# Patient Record
Sex: Female | Born: 1965 | Race: White | Hispanic: No | Marital: Single | Smoking: Current every day smoker
Health system: Southern US, Community
[De-identification: ages and names within clinical notes are randomized; demographics above are authoritative.]

## PROBLEM LIST (undated history)

## (undated) DIAGNOSIS — J449 Chronic obstructive pulmonary disease, unspecified: Secondary | ICD-10-CM

## (undated) DIAGNOSIS — K449 Diaphragmatic hernia without obstruction or gangrene: Secondary | ICD-10-CM

## (undated) DIAGNOSIS — A048 Other specified bacterial intestinal infections: Secondary | ICD-10-CM

## (undated) DIAGNOSIS — F419 Anxiety disorder, unspecified: Secondary | ICD-10-CM

## (undated) DIAGNOSIS — Z6841 Body Mass Index (BMI) 40.0 and over, adult: Secondary | ICD-10-CM

## (undated) HISTORY — PX: TONSILLECTOMY: SUR1361

## (undated) HISTORY — DX: Anxiety disorder, unspecified: F41.9

## (undated) HISTORY — DX: Chronic obstructive pulmonary disease, unspecified: J44.9

## (undated) HISTORY — PX: KNEE SURGERY: SHX244

## (undated) HISTORY — PX: CHOLECYSTECTOMY: SHX55

## (undated) HISTORY — DX: Diaphragmatic hernia without obstruction or gangrene: K44.9

## (undated) HISTORY — DX: Other specified bacterial intestinal infections: A04.8

## (undated) HISTORY — PX: DILATION AND CURETTAGE OF UTERUS: SHX78

## (undated) HISTORY — DX: Body Mass Index (BMI) 40.0 and over, adult: Z684

---

## 1997-04-27 HISTORY — PX: ABDOMINAL HYSTERECTOMY: SHX81

## 1997-04-27 HISTORY — PX: HEMORRHOID SURGERY: SHX153

## 1997-09-03 ENCOUNTER — Inpatient Hospital Stay (HOSPITAL_COMMUNITY): Admission: RE | Admit: 1997-09-03 | Discharge: 1997-09-05 | Payer: Self-pay | Admitting: Family Medicine

## 1997-10-02 ENCOUNTER — Inpatient Hospital Stay (HOSPITAL_COMMUNITY): Admission: RE | Admit: 1997-10-02 | Discharge: 1997-10-04 | Payer: Self-pay | Admitting: Obstetrics and Gynecology

## 1997-10-11 ENCOUNTER — Other Ambulatory Visit: Admission: RE | Admit: 1997-10-11 | Discharge: 1997-10-11 | Payer: Self-pay | Admitting: Obstetrics and Gynecology

## 1998-07-12 ENCOUNTER — Emergency Department (HOSPITAL_COMMUNITY): Admission: EM | Admit: 1998-07-12 | Discharge: 1998-07-12 | Payer: Self-pay | Admitting: Emergency Medicine

## 1998-07-12 ENCOUNTER — Encounter: Payer: Self-pay | Admitting: Emergency Medicine

## 1998-09-30 ENCOUNTER — Encounter: Admission: RE | Admit: 1998-09-30 | Discharge: 1998-11-01 | Payer: Self-pay | Admitting: Orthopedic Surgery

## 1999-05-12 ENCOUNTER — Emergency Department (HOSPITAL_COMMUNITY): Admission: EM | Admit: 1999-05-12 | Discharge: 1999-05-13 | Payer: Self-pay | Admitting: Emergency Medicine

## 1999-07-10 ENCOUNTER — Encounter: Payer: Self-pay | Admitting: Emergency Medicine

## 1999-07-10 ENCOUNTER — Emergency Department (HOSPITAL_COMMUNITY): Admission: EM | Admit: 1999-07-10 | Discharge: 1999-07-10 | Payer: Self-pay | Admitting: Emergency Medicine

## 1999-08-01 ENCOUNTER — Ambulatory Visit (HOSPITAL_COMMUNITY): Admission: RE | Admit: 1999-08-01 | Discharge: 1999-08-01 | Payer: Self-pay | Admitting: Orthopedic Surgery

## 1999-08-01 ENCOUNTER — Encounter: Payer: Self-pay | Admitting: Orthopedic Surgery

## 1999-08-18 ENCOUNTER — Emergency Department (HOSPITAL_COMMUNITY): Admission: EM | Admit: 1999-08-18 | Discharge: 1999-08-18 | Payer: Self-pay | Admitting: Emergency Medicine

## 1999-09-26 ENCOUNTER — Inpatient Hospital Stay (HOSPITAL_COMMUNITY): Admission: EM | Admit: 1999-09-26 | Discharge: 1999-09-29 | Payer: Self-pay

## 1999-09-26 ENCOUNTER — Encounter (HOSPITAL_BASED_OUTPATIENT_CLINIC_OR_DEPARTMENT_OTHER): Payer: Self-pay | Admitting: General Surgery

## 1999-10-27 ENCOUNTER — Ambulatory Visit (HOSPITAL_COMMUNITY): Admission: RE | Admit: 1999-10-27 | Discharge: 1999-10-27 | Payer: Self-pay | Admitting: General Surgery

## 1999-10-27 ENCOUNTER — Encounter (HOSPITAL_BASED_OUTPATIENT_CLINIC_OR_DEPARTMENT_OTHER): Payer: Self-pay | Admitting: General Surgery

## 2000-01-20 ENCOUNTER — Encounter: Payer: Self-pay | Admitting: Emergency Medicine

## 2000-01-20 ENCOUNTER — Emergency Department (HOSPITAL_COMMUNITY): Admission: EM | Admit: 2000-01-20 | Discharge: 2000-01-20 | Payer: Self-pay | Admitting: Emergency Medicine

## 2002-06-07 ENCOUNTER — Emergency Department (HOSPITAL_COMMUNITY): Admission: EM | Admit: 2002-06-07 | Discharge: 2002-06-08 | Payer: Self-pay | Admitting: Emergency Medicine

## 2002-06-07 ENCOUNTER — Encounter: Payer: Self-pay | Admitting: Emergency Medicine

## 2002-08-05 ENCOUNTER — Emergency Department (HOSPITAL_COMMUNITY): Admission: EM | Admit: 2002-08-05 | Discharge: 2002-08-05 | Payer: Self-pay | Admitting: Emergency Medicine

## 2002-08-05 ENCOUNTER — Encounter: Payer: Self-pay | Admitting: Emergency Medicine

## 2002-09-30 ENCOUNTER — Encounter: Payer: Self-pay | Admitting: Emergency Medicine

## 2002-09-30 ENCOUNTER — Emergency Department (HOSPITAL_COMMUNITY): Admission: EM | Admit: 2002-09-30 | Discharge: 2002-09-30 | Payer: Self-pay | Admitting: Emergency Medicine

## 2005-05-23 ENCOUNTER — Emergency Department (HOSPITAL_COMMUNITY): Admission: EM | Admit: 2005-05-23 | Discharge: 2005-05-23 | Payer: Self-pay | Admitting: Emergency Medicine

## 2007-07-07 ENCOUNTER — Emergency Department (HOSPITAL_COMMUNITY): Admission: EM | Admit: 2007-07-07 | Discharge: 2007-07-07 | Payer: Self-pay | Admitting: Emergency Medicine

## 2007-10-02 ENCOUNTER — Emergency Department (HOSPITAL_COMMUNITY): Admission: EM | Admit: 2007-10-02 | Discharge: 2007-10-03 | Payer: Self-pay | Admitting: Emergency Medicine

## 2007-12-30 ENCOUNTER — Emergency Department (HOSPITAL_COMMUNITY): Admission: EM | Admit: 2007-12-30 | Discharge: 2007-12-30 | Payer: Self-pay | Admitting: Emergency Medicine

## 2008-01-07 ENCOUNTER — Emergency Department (HOSPITAL_COMMUNITY): Admission: EM | Admit: 2008-01-07 | Discharge: 2008-01-07 | Payer: Self-pay | Admitting: Emergency Medicine

## 2008-01-28 ENCOUNTER — Emergency Department (HOSPITAL_COMMUNITY): Admission: EM | Admit: 2008-01-28 | Discharge: 2008-01-28 | Payer: Self-pay | Admitting: Emergency Medicine

## 2008-01-30 ENCOUNTER — Inpatient Hospital Stay (HOSPITAL_COMMUNITY): Admission: EM | Admit: 2008-01-30 | Discharge: 2008-02-01 | Payer: Self-pay | Admitting: Emergency Medicine

## 2008-02-21 ENCOUNTER — Ambulatory Visit: Payer: Self-pay | Admitting: Family Medicine

## 2008-03-01 ENCOUNTER — Emergency Department (HOSPITAL_COMMUNITY): Admission: EM | Admit: 2008-03-01 | Discharge: 2008-03-01 | Payer: Self-pay | Admitting: Emergency Medicine

## 2008-03-19 ENCOUNTER — Ambulatory Visit: Payer: Self-pay | Admitting: Internal Medicine

## 2008-03-27 ENCOUNTER — Encounter: Payer: Self-pay | Admitting: Family Medicine

## 2008-03-27 ENCOUNTER — Ambulatory Visit: Payer: Self-pay | Admitting: Family Medicine

## 2008-03-27 LAB — CONVERTED CEMR LAB
Chloride: 104 meq/L (ref 96–112)
Creatinine, Ser: 0.63 mg/dL (ref 0.40–1.20)
Free T4: 0.94 ng/dL (ref 0.89–1.80)
Potassium: 4.4 meq/L (ref 3.5–5.3)
TSH: 1.566 microintl units/mL (ref 0.350–4.50)

## 2008-03-28 ENCOUNTER — Emergency Department (HOSPITAL_COMMUNITY): Admission: EM | Admit: 2008-03-28 | Discharge: 2008-03-28 | Payer: Self-pay | Admitting: Emergency Medicine

## 2008-04-02 ENCOUNTER — Ambulatory Visit: Payer: Self-pay | Admitting: Family Medicine

## 2008-04-04 ENCOUNTER — Ambulatory Visit (HOSPITAL_COMMUNITY): Admission: RE | Admit: 2008-04-04 | Discharge: 2008-04-04 | Payer: Self-pay | Admitting: Family Medicine

## 2008-05-09 ENCOUNTER — Emergency Department (HOSPITAL_COMMUNITY): Admission: EM | Admit: 2008-05-09 | Discharge: 2008-05-09 | Payer: Self-pay | Admitting: Emergency Medicine

## 2008-05-10 ENCOUNTER — Encounter: Payer: Self-pay | Admitting: Family Medicine

## 2008-05-10 ENCOUNTER — Ambulatory Visit: Payer: Self-pay | Admitting: Internal Medicine

## 2008-05-10 LAB — CONVERTED CEMR LAB
Alkaline Phosphatase: 79 units/L (ref 39–117)
BUN: 10 mg/dL (ref 6–23)
Calcium: 9.2 mg/dL (ref 8.4–10.5)
Chloride: 104 meq/L (ref 96–112)
Creatinine, Ser: 0.61 mg/dL (ref 0.40–1.20)
HDL: 46 mg/dL (ref >39)
HIV-2 Ab: NEGATIVE
LDL Cholesterol: 139 mg/dL — ABNORMAL HIGH (ref 0–99)
Potassium: 4.4 meq/L (ref 3.5–5.3)
Total Bilirubin: 0.3 mg/dL (ref 0.3–1.2)
VLDL: 27 mg/dL (ref 0–40)

## 2008-05-21 ENCOUNTER — Ambulatory Visit: Payer: Self-pay | Admitting: Internal Medicine

## 2008-07-18 ENCOUNTER — Emergency Department (HOSPITAL_BASED_OUTPATIENT_CLINIC_OR_DEPARTMENT_OTHER): Admission: EM | Admit: 2008-07-18 | Discharge: 2008-07-18 | Payer: Self-pay | Admitting: Emergency Medicine

## 2008-07-18 ENCOUNTER — Ambulatory Visit: Payer: Self-pay | Admitting: Interventional Radiology

## 2008-08-08 ENCOUNTER — Encounter: Payer: Self-pay | Admitting: Family Medicine

## 2008-08-08 ENCOUNTER — Ambulatory Visit: Payer: Self-pay | Admitting: Family Medicine

## 2008-08-08 LAB — CONVERTED CEMR LAB
Basophils Relative: 0 % (ref 0–1)
Eosinophils Absolute: 0.1 10*3/uL (ref 0.0–0.7)
MCHC: 33.1 g/dL (ref 30.0–36.0)
MCV: 93.3 fL (ref 78.0–100.0)
Monocytes Absolute: 0.8 10*3/uL (ref 0.1–1.0)
Monocytes Relative: 9 % (ref 3–12)
Neutrophils Relative %: 67 % (ref 43–77)
RBC: 4.63 M/uL (ref 3.87–5.11)

## 2008-08-22 ENCOUNTER — Ambulatory Visit (HOSPITAL_COMMUNITY): Admission: RE | Admit: 2008-08-22 | Discharge: 2008-08-22 | Payer: Self-pay | Admitting: Family Medicine

## 2008-09-21 ENCOUNTER — Encounter: Payer: Self-pay | Admitting: Emergency Medicine

## 2008-09-22 ENCOUNTER — Inpatient Hospital Stay (HOSPITAL_COMMUNITY): Admission: EM | Admit: 2008-09-22 | Discharge: 2008-09-24 | Payer: Self-pay | Admitting: Internal Medicine

## 2008-09-23 ENCOUNTER — Ambulatory Visit: Payer: Self-pay | Admitting: Gastroenterology

## 2008-09-24 ENCOUNTER — Encounter: Payer: Self-pay | Admitting: Gastroenterology

## 2008-09-26 ENCOUNTER — Encounter: Payer: Self-pay | Admitting: Gastroenterology

## 2008-09-27 ENCOUNTER — Telehealth: Payer: Self-pay | Admitting: Gastroenterology

## 2008-10-09 ENCOUNTER — Ambulatory Visit: Payer: Self-pay | Admitting: Internal Medicine

## 2008-11-16 ENCOUNTER — Emergency Department (HOSPITAL_BASED_OUTPATIENT_CLINIC_OR_DEPARTMENT_OTHER): Admission: EM | Admit: 2008-11-16 | Discharge: 2008-11-17 | Payer: Self-pay | Admitting: Emergency Medicine

## 2008-11-17 ENCOUNTER — Emergency Department (HOSPITAL_BASED_OUTPATIENT_CLINIC_OR_DEPARTMENT_OTHER): Admission: EM | Admit: 2008-11-17 | Discharge: 2008-11-17 | Payer: Self-pay | Admitting: Emergency Medicine

## 2008-11-17 ENCOUNTER — Ambulatory Visit: Payer: Self-pay | Admitting: Diagnostic Radiology

## 2008-11-19 ENCOUNTER — Ambulatory Visit: Payer: Self-pay | Admitting: Internal Medicine

## 2008-11-20 ENCOUNTER — Inpatient Hospital Stay (HOSPITAL_COMMUNITY): Admission: EM | Admit: 2008-11-20 | Discharge: 2008-11-24 | Payer: Self-pay | Admitting: Emergency Medicine

## 2008-11-22 ENCOUNTER — Encounter: Payer: Self-pay | Admitting: Internal Medicine

## 2008-12-19 ENCOUNTER — Ambulatory Visit: Payer: Self-pay | Admitting: Internal Medicine

## 2009-02-15 ENCOUNTER — Encounter: Payer: Self-pay | Admitting: Family Medicine

## 2009-02-15 ENCOUNTER — Ambulatory Visit: Payer: Self-pay | Admitting: Internal Medicine

## 2009-02-21 ENCOUNTER — Ambulatory Visit: Payer: Self-pay | Admitting: Internal Medicine

## 2009-04-04 ENCOUNTER — Ambulatory Visit: Payer: Self-pay | Admitting: Family Medicine

## 2009-04-05 ENCOUNTER — Ambulatory Visit (HOSPITAL_COMMUNITY): Admission: RE | Admit: 2009-04-05 | Discharge: 2009-04-05 | Payer: Self-pay | Admitting: Internal Medicine

## 2009-04-09 ENCOUNTER — Ambulatory Visit: Payer: Self-pay | Admitting: Internal Medicine

## 2009-05-03 ENCOUNTER — Emergency Department (HOSPITAL_BASED_OUTPATIENT_CLINIC_OR_DEPARTMENT_OTHER): Admission: EM | Admit: 2009-05-03 | Discharge: 2009-05-03 | Payer: Self-pay | Admitting: Emergency Medicine

## 2009-05-10 ENCOUNTER — Emergency Department (HOSPITAL_COMMUNITY): Admission: EM | Admit: 2009-05-10 | Discharge: 2009-05-10 | Payer: Self-pay | Admitting: Emergency Medicine

## 2009-05-16 ENCOUNTER — Emergency Department (HOSPITAL_COMMUNITY): Admission: EM | Admit: 2009-05-16 | Discharge: 2009-05-16 | Payer: Self-pay | Admitting: Emergency Medicine

## 2009-08-18 ENCOUNTER — Emergency Department (HOSPITAL_COMMUNITY): Admission: EM | Admit: 2009-08-18 | Discharge: 2009-08-18 | Payer: Self-pay | Admitting: Emergency Medicine

## 2009-08-24 ENCOUNTER — Emergency Department (HOSPITAL_COMMUNITY): Admission: EM | Admit: 2009-08-24 | Discharge: 2009-08-24 | Payer: Self-pay | Admitting: Emergency Medicine

## 2010-08-01 IMAGING — CT CT ABDOMEN W/ CM
2 of 5 series · 17 of 46 positions shown, 19 images · IV contrast (agent unspecified)
Comparison: CT abdomen and pelvis without contrast same day

CT ABDOMEN

CLINICAL DATA: Right flank pain and right lower quadrant pain,
acute onset

CT ABDOMEN AND PELVIS WITH CONTRAST
TECHNIQUE: Multidetector CT imaging of the abdomen and pelvis was
performed using the standard protocol following bolus
administration of intravenous contrast.
Contrast: 120 ml Omni 300

[Series 2: abd/pelv with 5.0 b31f st · axial · 0.93mm/px · z∈[-507,-62]mm · 14 of 99 slices shown, 16 images]
[im 5/99  soft-tissue]
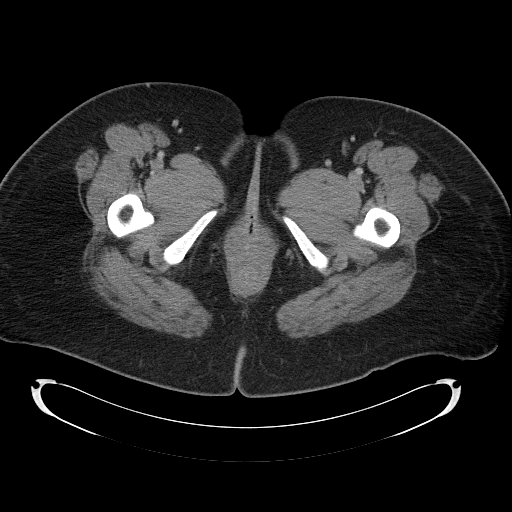
[im 5/99  bone]
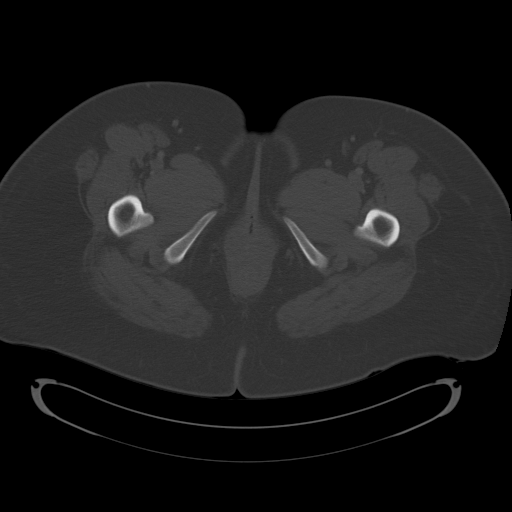
[im 15/99  soft-tissue]
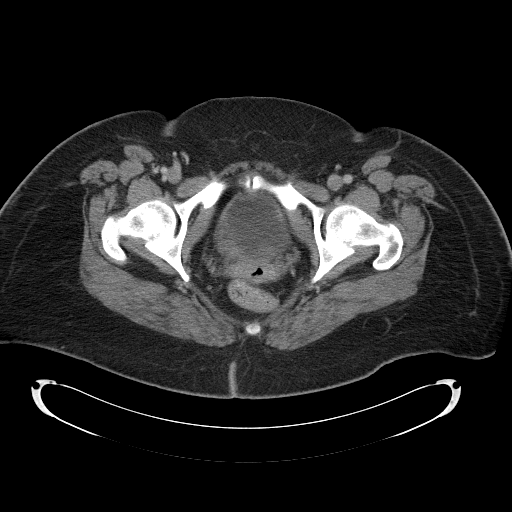
[im 20/99  soft-tissue]
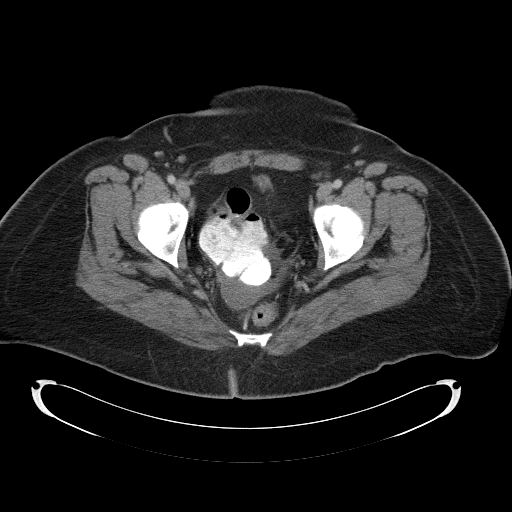
[im 25/99  soft-tissue]
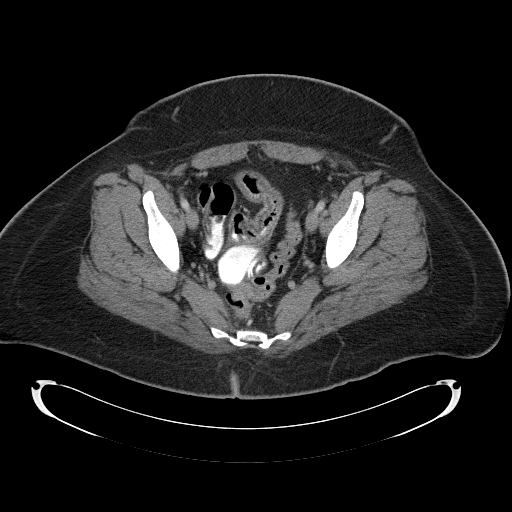
[im 35/99  soft-tissue]
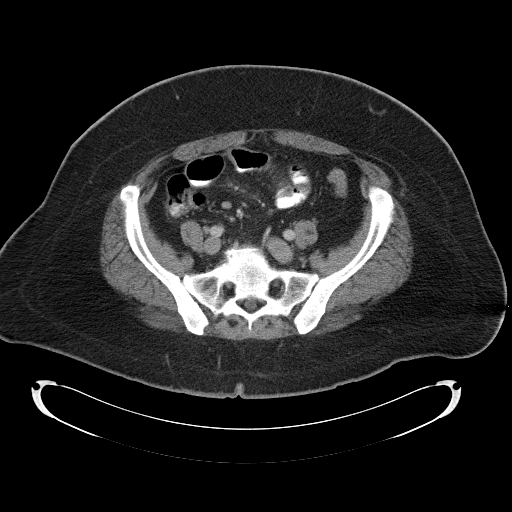
[im 40/99  soft-tissue]
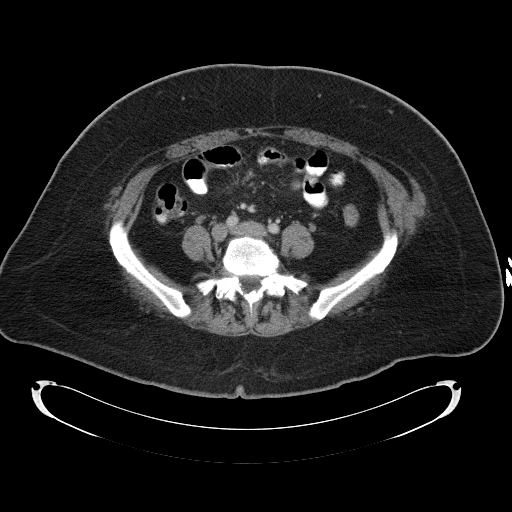
[im 45/99  soft-tissue]
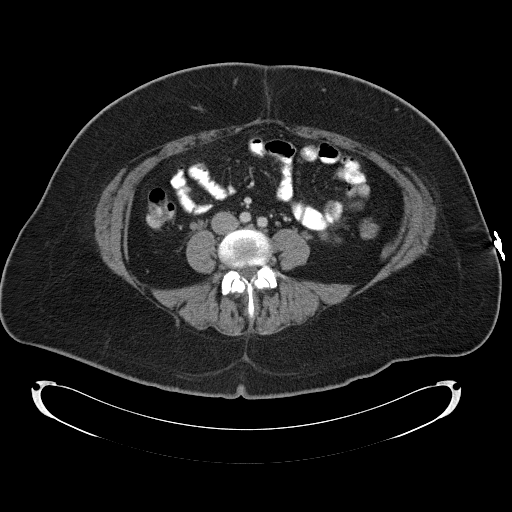
[im 54/99  soft-tissue]
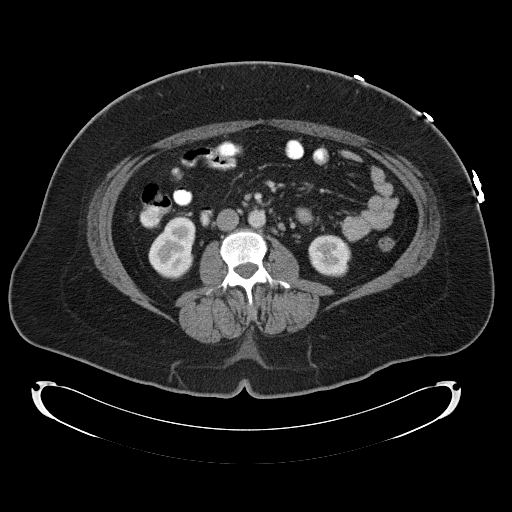
[im 59/99  soft-tissue]
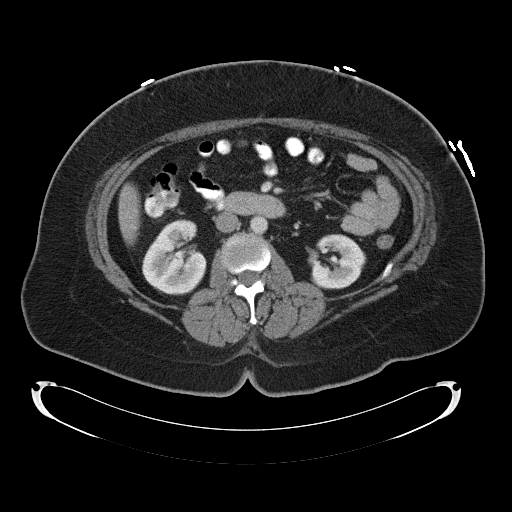
[im 59/99  bone]
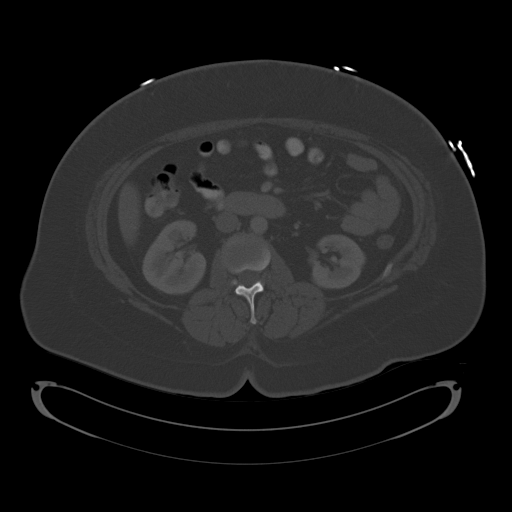
[im 64/99  soft-tissue]
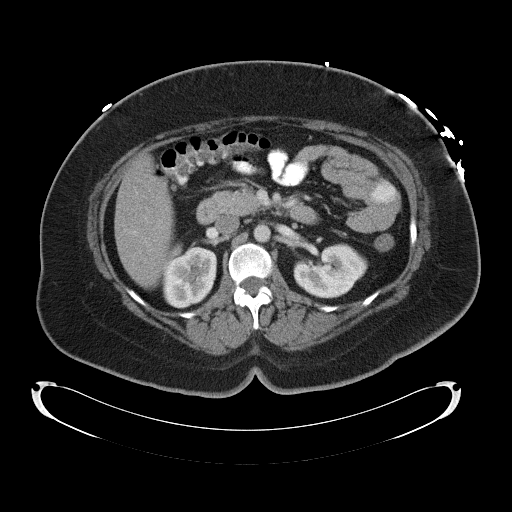
[im 74/99  soft-tissue]
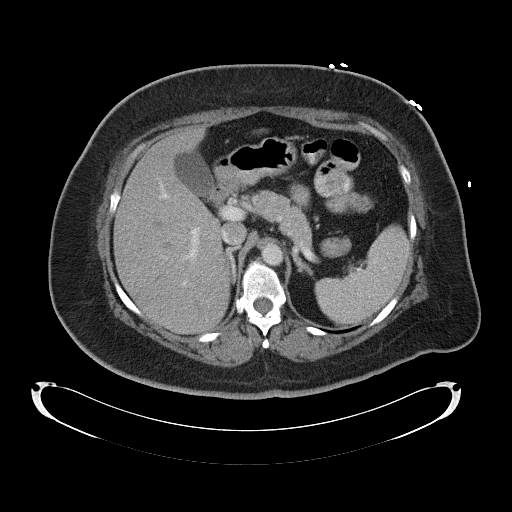
[im 79/99  soft-tissue]
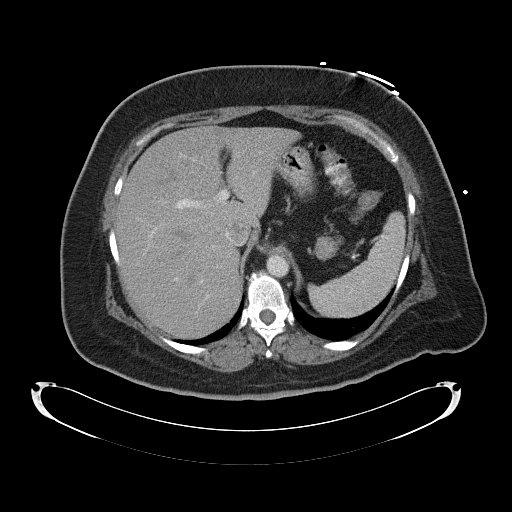
[im 84/99  soft-tissue]
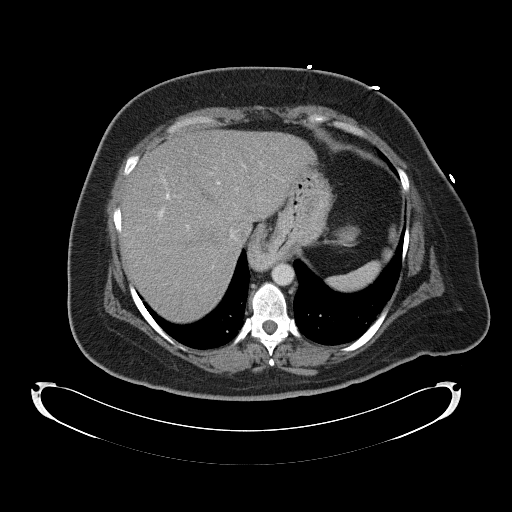
[im 94/99  soft-tissue]
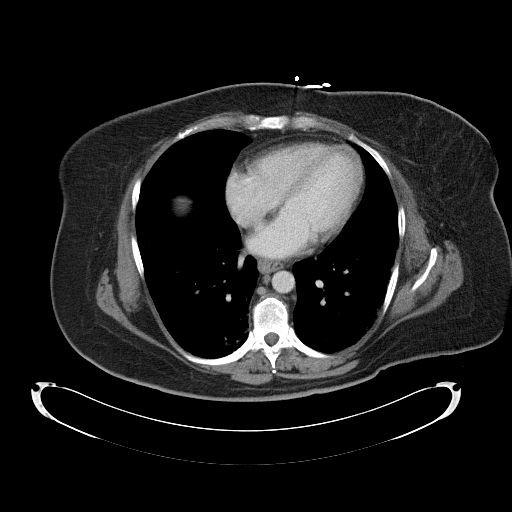

[Series 602: cor a/p · coronal · 0.96mm/px · 3 of 87 slices shown]
[im 29/87  soft-tissue]
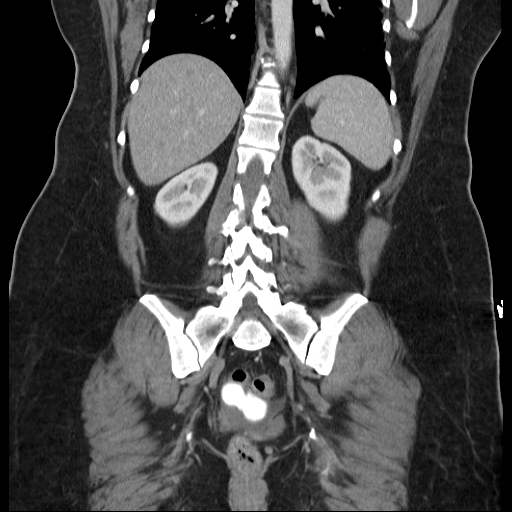
[im 39/87  soft-tissue]
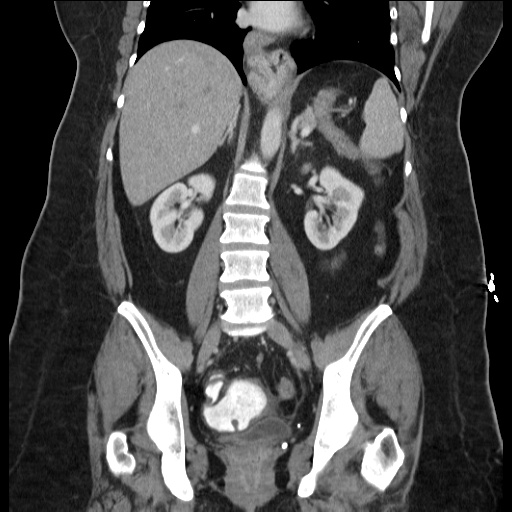
[im 48/87  soft-tissue]
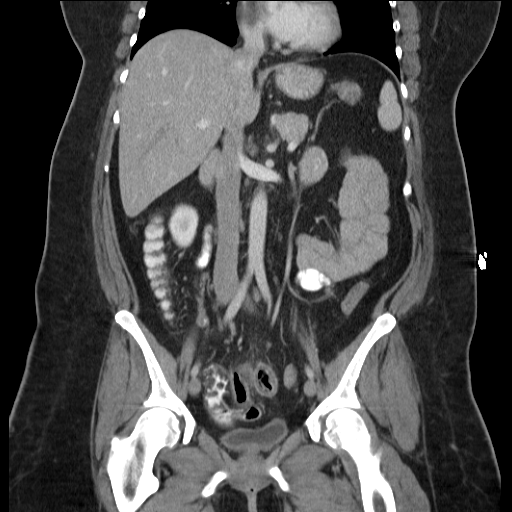

[17 of 46 positions shown; findings below may reference images not displayed]

FINDINGS: Mild bibasilar scarring. Otherwise the lungs appear
clear. Heart appears normal.

No focal hepatic lesion. The gallbladder, pancreas, spleen,
adrenal glands, and kidneys appear normal.

There is a small hiatal hernia. The stomach, duodenum, and
proximal small bowel are normal. There is mucosal thickening of
the distal ileum and terminal ileum (image 74). The the cecum
itself appears normal. The appendix is thin-walled and gas filled
without evidence of inflammation (image 73). The colon appears
normal.

Abdominal aorta is normal caliber. No evidence of retroperitoneal
or periportal lymphadenopathy.
IMPRESSION: 1. Normal appendix.
2. Bowel wall thickening of the distal ileum and terminal ileum.
Differential includes inflammatory bowel disease, terminal ileitis
from infectious etiology would also consider radiation enteritis in
patient with history of ovarian cancer, correlate clinically. Less
likely ischemic colitis as only the small bowel appears involve.

CT PELVIS
FINDINGS: Small amount of free fluid in the pelvis may be related
to the terminal ileitis described above. The bladder appears
normal. Prior hysterectomy. The rectum appears normal. Sigmoid
colon appears normal.

No evidence of pelvic lymphadenopathy.

Review of bone windows demonstrates no aggressive osseous lesions.
IMPRESSION: 1. Small amount of free fluid the pelvis likely related to
ileitis. See above discussion.
2. Hysterectomy and likely oophorectomy.

## 2010-08-03 LAB — URINALYSIS, ROUTINE W REFLEX MICROSCOPIC
Glucose, UA: NEGATIVE mg/dL
Ketones, ur: NEGATIVE mg/dL
Nitrite: NEGATIVE
Protein, ur: NEGATIVE mg/dL
Protein, ur: NEGATIVE mg/dL
Specific Gravity, Urine: 1.027 (ref 1.005–1.030)
Urobilinogen, UA: 1 mg/dL (ref 0.0–1.0)
Urobilinogen, UA: 1 mg/dL (ref 0.0–1.0)
pH: 6.5 (ref 5.0–8.0)

## 2010-08-03 LAB — COMPREHENSIVE METABOLIC PANEL
AST: 19 U/L (ref 0–37)
Albumin: 4.3 g/dL (ref 3.5–5.2)
BUN: 10 mg/dL (ref 6–23)
BUN: 11 mg/dL (ref 6–23)
CO2: 24 mEq/L (ref 19–32)
CO2: 28 mEq/L (ref 19–32)
Chloride: 105 mEq/L (ref 96–112)
Chloride: 104 mEq/L (ref 96–112)
Creatinine, Ser: 0.64 mg/dL (ref 0.4–1.2)
Creatinine, Ser: 0.7 mg/dL (ref 0.4–1.2)
GFR calc non Af Amer: >60 mL/min (ref >60)
GFR calc non Af Amer: >60 mL/min (ref >60)
Glucose, Bld: 86 mg/dL (ref 70–99)
Glucose, Bld: 106 mg/dL — ABNORMAL HIGH (ref 70–99)
Total Bilirubin: 0.4 mg/dL (ref 0.3–1.2)
Total Bilirubin: 0.3 mg/dL (ref 0.3–1.2)

## 2010-08-03 LAB — LIPASE, BLOOD
Lipase: 18 U/L (ref 11–59)
Lipase: 98 U/L (ref 23–300)

## 2010-08-03 LAB — CBC
HCT: 34.4 % — ABNORMAL LOW (ref 36.0–46.0)
HCT: 38.3 % (ref 36.0–46.0)
HCT: 38.6 % (ref 36.0–46.0)
HCT: 44.3 % (ref 36.0–46.0)
Hemoglobin: 13.2 g/dL (ref 12.0–15.0)
MCHC: 33.8 g/dL (ref 30.0–36.0)
MCV: 91.9 fL (ref 78.0–100.0)
MCV: 92.9 fL (ref 78.0–100.0)
MCV: 93.5 fL (ref 78.0–100.0)
MCV: 92.7 fL (ref 78.0–100.0)
Platelets: 172 10*3/uL (ref 150–400)
Platelets: 173 10*3/uL (ref 150–400)
Platelets: 175 10*3/uL (ref 150–400)
Platelets: 255 10*3/uL (ref 150–400)
RBC: 4.17 MIL/uL (ref 3.87–5.11)
RDW: 13.5 % (ref 11.5–15.5)
RDW: 13.7 % (ref 11.5–15.5)
RDW: 13.9 % (ref 11.5–15.5)
WBC: 5.9 10*3/uL (ref 4.0–10.5)
WBC: 6.6 10*3/uL (ref 4.0–10.5)
WBC: 9.3 10*3/uL (ref 4.0–10.5)
WBC: 10.2 10*3/uL (ref 4.0–10.5)

## 2010-08-03 LAB — DIFFERENTIAL
Basophils Absolute: 0.1 10*3/uL (ref 0.0–0.1)
Basophils Absolute: 0.1 10*3/uL (ref 0.0–0.1)
Basophils Absolute: 0.3 10*3/uL — ABNORMAL HIGH (ref 0.0–0.1)
Eosinophils Absolute: 0.1 10*3/uL (ref 0.0–0.7)
Eosinophils Relative: 1 % (ref 0–5)
Eosinophils Relative: 2 % (ref 0–5)
Lymphocytes Relative: 24 % (ref 12–46)
Lymphocytes Relative: 24 % (ref 12–46)
Neutro Abs: 6.1 10*3/uL (ref 1.7–7.7)
Neutro Abs: 6.5 10*3/uL (ref 1.7–7.7)
Neutrophils Relative %: 64 % (ref 43–77)

## 2010-08-03 LAB — LIPID PANEL
Cholesterol: 173 mg/dL (ref 0–200)
HDL: 38 mg/dL — ABNORMAL LOW (ref >39)
LDL Cholesterol: 118 mg/dL — ABNORMAL HIGH (ref 0–99)
Total CHOL/HDL Ratio: 4.6 RATIO

## 2010-08-03 LAB — BASIC METABOLIC PANEL
BUN: 4 mg/dL — ABNORMAL LOW (ref 6–23)
BUN: 8 mg/dL (ref 6–23)
Calcium: 8.6 mg/dL (ref 8.4–10.5)
Chloride: 109 mEq/L (ref 96–112)
Creatinine, Ser: 0.68 mg/dL (ref 0.4–1.2)
Creatinine, Ser: 0.7 mg/dL (ref 0.4–1.2)
GFR calc non Af Amer: >60 mL/min (ref >60)
Glucose, Bld: 90 mg/dL (ref 70–99)
Glucose, Bld: 91 mg/dL (ref 70–99)
Sodium: 139 mEq/L (ref 135–145)

## 2010-08-03 LAB — RAPID URINE DRUG SCREEN, HOSP PERFORMED
Amphetamines: NOT DETECTED
Barbiturates: NOT DETECTED
Cocaine: NOT DETECTED
Opiates: POSITIVE — AB
Tetrahydrocannabinol: NOT DETECTED

## 2010-08-03 LAB — CK TOTAL AND CKMB (NOT AT ARMC)
CK, MB: 0.9 ng/mL (ref 0.3–4.0)
Relative Index: INVALID (ref 0.0–2.5)
Total CK: 58 U/L (ref 7–177)

## 2010-08-03 LAB — URINE MICROSCOPIC-ADD ON

## 2010-08-03 LAB — CLOSTRIDIUM DIFFICILE EIA: C difficile Toxins A+B, EIA: NEGATIVE

## 2010-08-03 LAB — FECAL LACTOFERRIN, QUANT: Fecal Lactoferrin: POSITIVE

## 2010-08-03 LAB — URINE CULTURE

## 2010-08-03 LAB — MAGNESIUM: Magnesium: 1.9 mg/dL (ref 1.5–2.5)

## 2010-08-03 LAB — STOOL CULTURE

## 2010-08-03 LAB — TSH: TSH: 6.129 u[IU]/mL — ABNORMAL HIGH (ref 0.350–4.500)

## 2010-08-03 LAB — LACTIC ACID, PLASMA: Lactic Acid, Venous: 1.3 mmol/L (ref 0.5–2.2)

## 2010-08-03 LAB — OVA AND PARASITE EXAMINATION

## 2010-08-03 LAB — T3, FREE: T3, Free: 2.8 pg/mL (ref 2.3–4.2)

## 2010-08-05 LAB — DIFFERENTIAL
Basophils Absolute: 0 10*3/uL (ref 0.0–0.1)
Basophils Absolute: 0.1 10*3/uL (ref 0.0–0.1)
Basophils Relative: 0 % (ref 0–1)
Basophils Relative: 1 % (ref 0–1)
Eosinophils Absolute: 0.1 10*3/uL (ref 0.0–0.7)
Lymphocytes Relative: 23 % (ref 12–46)
Monocytes Absolute: 0.8 10*3/uL (ref 0.1–1.0)
Monocytes Relative: 11 % (ref 3–12)
Neutro Abs: 5.6 10*3/uL (ref 1.7–7.7)
Neutrophils Relative %: 66 % (ref 43–77)

## 2010-08-05 LAB — CBC
HCT: 43.9 % (ref 36.0–46.0)
Hemoglobin: 13.6 g/dL (ref 12.0–15.0)
Hemoglobin: 15.1 g/dL — ABNORMAL HIGH (ref 12.0–15.0)
MCHC: 33.7 g/dL (ref 30.0–36.0)
MCV: 92.2 fL (ref 78.0–100.0)
MCV: 93.3 fL (ref 78.0–100.0)
Platelets: 223 10*3/uL (ref 150–400)
RBC: 4.34 MIL/uL (ref 3.87–5.11)
RDW: 14 % (ref 11.5–15.5)
WBC: 8.5 10*3/uL (ref 4.0–10.5)

## 2010-08-05 LAB — BASIC METABOLIC PANEL
CO2: 28 mEq/L (ref 19–32)
Calcium: 8.9 mg/dL (ref 8.4–10.5)
Chloride: 104 mEq/L (ref 96–112)
Creatinine, Ser: 0.64 mg/dL (ref 0.4–1.2)
GFR calc Af Amer: >60 mL/min (ref >60)
Glucose, Bld: 94 mg/dL (ref 70–99)

## 2010-08-05 LAB — COMPREHENSIVE METABOLIC PANEL
Albumin: 4 g/dL (ref 3.5–5.2)
Alkaline Phosphatase: 78 U/L (ref 39–117)
BUN: 9 mg/dL (ref 6–23)
CO2: 27 mEq/L (ref 19–32)
Chloride: 105 mEq/L (ref 96–112)
Creatinine, Ser: 0.74 mg/dL (ref 0.4–1.2)
GFR calc non Af Amer: >60 mL/min (ref >60)
Glucose, Bld: 113 mg/dL — ABNORMAL HIGH (ref 70–99)
Potassium: 3.6 mEq/L (ref 3.5–5.1)
Total Bilirubin: 0.3 mg/dL (ref 0.3–1.2)

## 2010-08-05 LAB — CARDIAC PANEL(CRET KIN+CKTOT+MB+TROPI)
CK, MB: 0.8 ng/mL (ref 0.3–4.0)
Relative Index: INVALID (ref 0.0–2.5)
Total CK: 47 U/L (ref 7–177)

## 2010-08-05 LAB — CK TOTAL AND CKMB (NOT AT ARMC)
Relative Index: INVALID (ref 0.0–2.5)
Total CK: 62 U/L (ref 7–177)

## 2010-08-05 LAB — PROTIME-INR
INR: 1 (ref 0.00–1.49)
Prothrombin Time: 13.2 seconds (ref 11.6–15.2)

## 2010-08-05 LAB — D-DIMER, QUANTITATIVE: D-Dimer, Quant: 0.47 ug/mL-FEU (ref 0.00–0.48)

## 2010-08-05 LAB — POCT CARDIAC MARKERS
Myoglobin, poc: 34.6 ng/mL (ref 12–200)
Troponin i, poc: <0.05 ng/mL (ref 0.00–0.09)

## 2010-08-05 LAB — LIPASE, BLOOD: Lipase: 20 U/L (ref 11–59)

## 2010-08-05 LAB — CLOTEST (H. PYLORI), BIOPSY: Helicobacter screen: POSITIVE — AB

## 2010-08-11 LAB — POCT I-STAT, CHEM 8
HCT: 44 % (ref 36.0–46.0)
Hemoglobin: 15 g/dL (ref 12.0–15.0)
Potassium: 4.1 mEq/L (ref 3.5–5.1)
Sodium: 140 mEq/L (ref 135–145)
TCO2: 26 mmol/L (ref 0–100)

## 2010-09-08 ENCOUNTER — Emergency Department (HOSPITAL_BASED_OUTPATIENT_CLINIC_OR_DEPARTMENT_OTHER)
Admission: EM | Admit: 2010-09-08 | Discharge: 2010-09-08 | Disposition: A | Discharge status: Home / Self Care | Payer: Managed Care, Other (non HMO) | Attending: Emergency Medicine | Admitting: Emergency Medicine

## 2010-09-08 DIAGNOSIS — A599 Trichomoniasis, unspecified: Secondary | ICD-10-CM | POA: Insufficient documentation

## 2010-09-08 DIAGNOSIS — I10 Essential (primary) hypertension: Secondary | ICD-10-CM | POA: Insufficient documentation

## 2010-09-08 DIAGNOSIS — R197 Diarrhea, unspecified: Secondary | ICD-10-CM | POA: Insufficient documentation

## 2010-09-08 DIAGNOSIS — F411 Generalized anxiety disorder: Secondary | ICD-10-CM | POA: Insufficient documentation

## 2010-09-08 DIAGNOSIS — F17200 Nicotine dependence, unspecified, uncomplicated: Secondary | ICD-10-CM | POA: Insufficient documentation

## 2010-09-08 LAB — CBC
HCT: 42.2 % (ref 36.0–46.0)
MCHC: 33.9 g/dL (ref 30.0–36.0)
Platelets: 188 10*3/uL (ref 150–400)
RDW: 14.1 % (ref 11.5–15.5)
WBC: 9.6 10*3/uL (ref 4.0–10.5)

## 2010-09-08 LAB — DIFFERENTIAL
Basophils Absolute: 0 10*3/uL (ref 0.0–0.1)
Basophils Relative: 0 % (ref 0–1)
Eosinophils Relative: 1 % (ref 0–5)
Lymphocytes Relative: 9 % — ABNORMAL LOW (ref 12–46)

## 2010-09-08 LAB — COMPREHENSIVE METABOLIC PANEL
ALT: 18 U/L (ref 0–35)
AST: 16 U/L (ref 0–37)
Albumin: 4 g/dL (ref 3.5–5.2)
Alkaline Phosphatase: 83 U/L (ref 39–117)
Calcium: 9.5 mg/dL (ref 8.4–10.5)
GFR calc Af Amer: >60 mL/min (ref >60)
Glucose, Bld: 108 mg/dL — ABNORMAL HIGH (ref 70–99)
Potassium: 3.8 mEq/L (ref 3.5–5.1)
Sodium: 137 mEq/L (ref 135–145)
Total Protein: 7 g/dL (ref 6.0–8.3)

## 2010-09-08 LAB — URINALYSIS, ROUTINE W REFLEX MICROSCOPIC
Bilirubin Urine: NEGATIVE
Ketones, ur: NEGATIVE mg/dL

## 2010-09-08 LAB — URINE MICROSCOPIC-ADD ON
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
## 2010-09-09 NOTE — H&P (Signed)
[name], [name]                ACCOUNT NO.:  [id number]   MEDICAL RECORD NO.:  [id number]          PATIENT TYPE:  INP   LOCATION:  1333                         [facility]AC[facility]   PHYSICIAN:  Lucile Crater, MD         DATE OF BIRTH:  Dec 23, 1965   DATE OF ADMISSION:  11/20/2008  DATE OF DISCHARGE:                              HISTORY & PHYSICAL   PRIMARY CARE PHYSICIAN:  [facility]   CHIEF COMPLAINT:  Abdominal pain for the past few days.   HISTORY OF PRESENT ILLNESS:  [name] is a 45 year old female with  history of ovarian carcinoma in the past, status post TAH with BSO.  She  comes to the ER with 3-4 days complaining of abdominal pain.  The pain  was located in the left and right lower quadrants.  It is nonradiating,  10/10 in intensity.  It is sharp in nature.  The onset was sudden, and  the course was persistent.  However, the pain is recurrent, lasts for up  to one hour when she gets it.  It has worsened with meals.  There is no  association with bowel movements.  She also reports to have 2-3 loose  stools per day, but this is not acute.  She denies having any fever or  chills.  There is no nausea or vomiting associated with the abdominal  pain.  She also denies having melena or red blood per rectum.  She had a  CT scan done on July 24 as part of a workup for this abdominal pain that  showed thickening of the ileal wall which was concerning for irritable  bowel disease versus infectious versus radiation enteritis.  The patient  did not have radiation therapy ever in her life.   REVIEW OF SYSTEMS:  Right ear pain.  Other than that, a complete review  of systems was done which included General, Head, Eyes, Ears, Nose,  Throat, Cardiovascular, Respiratory, GI/GU, Endocrine, Musculoskeletal,  Neurological, Psychiatric all within normal limits other than as  mentioned above.   PAST MEDICAL HISTORY:  1. Atypical chest pain.  2. Gastritis.  3. Esophageal dysmotility.  4.  Generalized anxiety disorder.  5. Ovarian cancer status post hysterectomy and bilateral salpingo-      oophorectomy.  6. Hypertension.  7. Gastroesophageal reflux disease.  8. Left knee arthroscopic surgery.  9. Hyperlipidemia.  10.Obesity.  11.Elevated TSH.   ALLERGIES:  ASPIRIN CAUSES ANAPHYLAXIS.   SOCIAL HISTORY:  History of tobacco abuse which is ongoing.  She smokes  one pack of cigarettes per day.  She rarely consumes alcohol.  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   FAMILY HISTORY:  Coronary artery disease in her father.  Her mother  passed away secondary to cancer.   PHYSICAL EXAMINATION:  VITAL SIGNS:  T-max 98.7, blood pressure 102/54,  O2 saturation 99% on room air, pulse rate 79, respirations 18.  GENERAL:  Not in acute distress.  Awake, alert and oriented to time,  place, person and  situation.  Afebrile.  HEENT:  Normocephalic, atraumatic.  Pupils equal and reactive to light  and accommodation.  Extraocular movements intact.  Mucous membranes  moist.  Questionable right tympanic membrane perforation.  NECK:  Supple.  No JVD, lymphadenopathy or carotid bruits.  CV:  Regular rhythm.  Rate is normal.  No murmurs, rubs or gallops.  LUNGS:  Clear to auscultation bilaterally.  ABDOMEN:  Candor all over, most marked in the right and left lower  quadrants.  No costovertebral angle tenderness.  No hepatosplenomegaly.  EXTREMITIES:  No cyanosis, clubbing or edema.  NEUROLOGICAL:  Grossly nonfocal.   LABORATORY DATA:  CBC with differential:  WBC 9300, hemoglobin 13,  hematocrit 38, platelets 98,000, differential within normal limits.  Sodium 137, potassium 4, chloride 105, bicarbonate 24, BUN 10,  creatinine 0.64, blood glucose 86.  Total protein 6.3, albumin 3.8, ALT  17, AST 19, alk-phos 68, total bilirubin 0.4.  Serum lipase 18.  Urinalysis:  Within normal limits.   CT scan of the abdomen dated November 17, 2008:   1. Terminal ileum has a normal appearance, now with subtle stranding      in the adjacent mesentery and involving the pericecal fat.      differential considerations remain irritable bowel disease,      recurrent infection/typhlitis , radiation enteritis.  2. Mild paraesophageal hernia.  3. Trace pelvic free fluid decreased from previous exam.  Otherwise,      no acute findings identified in the pelvis.   ASSESSMENT/PLAN:  1. Abdominal pain.  The patient has been having this pain. She does      look to be in pain.  She has mild diarrhea, but not very      significant.  We will evaluate for any infectious etiology with      stool WBC and stool ova and parasites.  We will also get a stool      fat to evaluate if she is having any liver abnormalities.  The CT      scan was concerning for thickening in the ileum.  Possibilities      include IBD versus typhlitis versus infectious versus radiation      enteritis, but she does not have a history of radiation therapy.      We will consult with GI for a possible endoscopy and biopsy.  Will      make her n.p.o., give her plenty of IV fluids and will deliver with      pain control.  2. Gastroesophageal reflux disease.  Will start her on PPI.  3. Esophageal dysmotility.  The patient is asymptomatic.  4. Hypertension.  Controlled with medications.  5. Anxiety.  Partly stable.  6. Deep vein thrombosis prophylaxis with SCDs.  7. Fluids, Electrolytes and Nutrition.  Will start her on normal      saline.  Will replace electrolytes as needed.  She will be made      n.p.o.      Lucile Crater, MD  Electronically Signed     TA/MEDQ  D:  11/20/2008  T:  11/20/2008  Job:  [id number]

## 2010-09-09 NOTE — Discharge Summary (Signed)
[name], [name]                ACCOUNT NO.:  [id number]   MEDICAL RECORD NO.:  [id number]          PATIENT TYPE:  INP   LOCATION:  1402                         FACILITY:  [facility]   PHYSICIAN:  Eduard Clos, MDDATE OF BIRTH:  [birth year]   DATE OF ADMISSION:  09/22/2008  DATE OF DISCHARGE:                               DISCHARGE SUMMARY   COURSE IN THE HOSPITAL:  A 45 year old female patient who was  complaining of chest pain was admitted to telemetry floor.  Serial  cardiac enzymes were done which were within acceptable limits.  EKG did  not show any acute findings.  I did discuss with Dr. Clydene Pugh who was on  call for Dr. Patty Sermons.  Per the patient did have a cardiac cath in  October 2009 which showed completely normal carotids, and advised no  further workup cardiac-wise.   Since patient had difficulty swallowing and with his chest pain, I  called GI consult with Dr. Russella Dar who did an EGD which showed only  gastritis and  , and he recommended PPI for 40 mg twice daily for 1  month and followed by daily  .  The patient did have barium swallow  which showed some  per Dr. Russella Dar not significant at this time.  At this  time patient will be discharged home.  I have advised that she will need  to follow up with pending biopsies done during EGD with her family care  physician to which she has agreed.   PROCEDURES DONE DURING STAY:  1. EGD on May 31  .  2. Barium swallow showed esophageal dysmotility,  normal tertiary      contractions.  3. Xray left ankle, nothing acute.  4. Chest x-ray on Sep 21, 2008 showed no acute cardiopulmonary      process, central bronchitic coughing similar to prior changes.   PERTINENT LABORATORIES:  Per this patient's cardiac enzymes are  negative.   FINAL DIAGNOSES:  1. Atypical chest pain.  2. Gastritis  .   MEDICATION AT DISCHARGE:  1. Protonix 40 mg p.o. b.i.d. for 1 month.  p.o. daily thereafter.   PLAN:  The patient advised to follow with  her primary care physician and  follow pending biopsy result and EGD.  To be on a regular diet.  The  patient may follow with Dr. Russella Dar as needed for which contact  information has been given.      Eduard Clos, MD  Electronically Signed    ANK/MEDQ  D:  09/24/2008  T:  09/24/2008  Job:  [id number]

## 2010-09-09 NOTE — Discharge Summary (Signed)
NAMEBHAVIKA, SCHNIDER                ACCOUNT NO.:  [id number]   MEDICAL RECORD NO.:  [id number]          PATIENT TYPE:  INP   LOCATION:  1333                         FACILITY:  [facility]   PHYSICIAN:  Marcellus Scott, MD     DATE OF BIRTH:  1966-02-03   DATE OF ADMISSION:  11/19/2008  DATE OF DISCHARGE:  11/23/2008                               DISCHARGE SUMMARY   PRIMARY CARE PHYSICIAN:  Dr. Tressia Danas at [facility].   DISCHARGE DIAGNOSES:  1. Nonspecific or viral gastroenteritis.  Negative colonoscopy.  2. Right otitis externa +/- otitis media.  3. New hypothyroid.  4. Tobacco abuse.  5. Hypertension.  6. History of gastritis and esophageal dysmotility.  7. History of generalized anxiety disorder.  8. History of hypertension, not on medication.  9. Gastroesophageal reflux disease.  10.Hyperlipidemia, on no medication.   DISCHARGE MEDICATIONS:  1. Nicotine 21 mg per 24 hour patch, one patch daily for 5 weeks then      taper.  Two week supply provided.  2. Levothyroxine 12.5 mcg p.o. daily.  3. Ciprodex otic suspension 2 drops in the right ear b.i.d. for 1      week.  4. Tylenol 650 mg p.o. q.4-6 hourly p.r.n. for pain.   PROCEDURES:  1. Colonoscopy by Dr. Marina Goodell on November 22, 2008.  Impression - normal      colon.  Normal terminal ileum/ileum.  No inflammatory bowel disease      or abnormality.  Suspect nonspecific gastroenteritis.  2. X-ray of the abdomen November 19, 2008.  No evidence of bowel      obstruction or intraperitoneal free air.  3. Chest x-ray November 19, 2008.  Impression - no evidence of acute      cardiopulmonary process.  4. CT of the abdomen with contrast July 24.  Impression - terminal      ileum again has an abnormal appearance, now with subtle stranding      in the adjacent mesentery and involving the pericecal fat.  Top      differential considerations according to radiology were      inflammatory bowel disease, recurrent infection or typhlitis and   radiation enteritis.  Mild progression of paraesophageal hernia.  5. CT of the pelvis.  Impression - trace pelvic free fluid, decreased      from previous exam.  Otherwise no acute findings indicated in the      pelvis.  6. X-ray abdomen on July 24.  Impression - nonobstructive bowel gas      pattern.  Scattered air fluid levels within nondistended bowel are      nonspecific and can be seen with enteritis and diarrhea of various      etiologies.  7. Chest x-ray November 17, 2008.  No acute chest findings.   PERTINENT LABS:  Stool for ova and parasites were negative but had  moderate yeast.  CBC - hemoglobin 13.1, hematocrit 39, white blood cell  6.6, platelets 173.  Stool cultures negative to date.  Free T3 2.8, free  T4 0.72, TSH 5.199.  C. difficile toxin negative.  Basic  metabolic panel  unremarkable with BUN 4, creatinine 0.68, fecal lactoferrin positive,  urine drug screen positive for opiates which she probably received in  the hospital.  TSH of 6.129.  Lipid panel with HDL 38, LDL 118,  otherwise unremarkable.  Troponin 0.02, magnesium 1.9.  Other cardiac  enzymes negative.  Lipase 18.  Hepatic panel July 26 unremarkable.  Urinalysis was not suggestive of urinary tract infection.  Urine  pregnancy test negative.   CONSULTATIONS:  Gastroenterology, Dr. Marina Goodell.   HOSPITAL COURSE AND PATIENT DISPOSITION:  1. Ms. Schellhorn is a pleasant 45 year old Caucasian female patient with a      history of ovarian carcinoma status post total abdominal      hysterectomy and bilateral salpingo-oophorectomy, atypical chest      pain, gastritis, esophageal dysmotility, generalized anxiety      disorder, hypertension, gastroesophageal reflux disease, abnormal      thyroid function tests who presented with abdominal pain ongoing      for 3-4 days worsened by meals.  She also had 2-3 loose stools per      day.  There was no nausea or vomiting.  She was not on any home      medications.  She was admitted to  the hospital.  She had come into      the emergency room 2 days prior to this admission and CT had shown      ileal thickening concerning for irritable bowel syndrome versus      infection versus radiation enteritis.  She was now admitted to the      hospital for further evaluation and management.  GI was consulted.      They proceeded to perform colonoscopy yesterday.  Dr. Marina Goodell      recommends diet as tolerated, avoid narcotics and has cleared her      for discharge.  The patient had been on clear liquids until the      bowel prep.  At this time will advance diet and once the patient      tolerates it she will be discharged home.  The patient indicates      she had some cramping this morning but it is better than yesterday.      Apparently her oxygen saturations dropped briefly when she was      asleep probably secondary to narcotic pain medication and sedation.      Today her oxygen saturations are in the 90s.  She denies snoring or      history of sleep apnea.   1. Hypothyroid: The patient's thyroid function tests are suggestive of      hypothyroidism.  Will start levothyroxine supplementation and      recommend repeating thyroid function tests in 4-6 weeks.   1. Right otitis externa plus or minus otitis media.  The patient      developed sudden onset of right-sided earache and difficulty      hearing 3 days prior to admission.  There was no history of trauma      and no ear discharge.  Our attempts to look into the right ear did      not show any external abnormality although her pinna was tender on      movement.  There was some suspicion of a perforated tympanic      membrane at 12 o'clock position.  She was started on Cipro ear      drops and Augmentin.  ENT was consulted.  Dr. Jearld Fenton  indicated that      she had otitis externa and may have otitis media.  He was unable to      see the tympanic membrane.  I spoke with him this morning and given      her history of abdominal pain I  am hesitant to continue oral      antibiotics.  He recommended the patient could be discharged on      Cipro ear drops alone and is to follow up with him early next week.      The patient is to call for such appointment.   The patient has been advised to seek immediate medical attention if  there is any further deterioration of her condition.  She verbalizes  understanding.  She also requests a nicotine patch to quit tobacco.  Cessation counseling has been done.   At this time will advance diet and if the patient tolerates she will be  discharged home.   Time taken in coordinating this discharge is 35 minutes.      Marcellus Scott, MD  Electronically Signed     AH/MEDQ  D:  11/23/2008  T:  11/23/2008  Job:  [id number]   cc:   Sharin Grave, MD   Wilhemina Bonito. Marina Goodell, MD  [address]  [place]  [place] [address]

## 2010-09-09 NOTE — H&P (Signed)
[name], [name]                ACCOUNT NO.:  [id number]   MEDICAL RECORD NO.:  [id number]          PATIENT TYPE:  EMS   LOCATION:  MAJO                         FACILITY:  [facility]   PHYSICIAN:  Della Goo, M.D. DATE OF BIRTH:  Aug 12, 1965   DATE OF ADMISSION:  09/21/2008  DATE OF DISCHARGE:                              HISTORY & PHYSICAL   PRIMARY CARE PHYSICIAN:  [facility]   CHIEF COMPLAINT:  Chest pain.   HISTORY OF PRESENT ILLNESS:  This is a 45 year old female who presents  to the emergency department with complaints of mid chest area chest pain  which she reports having off and on for the past 2 weeks.  She describes  the chest pain as being heaviness in the center of her chest.  She  states the pain at the worst is about a 9/10.  She states that she has  associated symptoms of shortness of breath, nausea and diaphoresis.  Of  note the patient was hospitalized in October of 2009 for atypical chest  pain at that time.  She had a workup and this was found to be negative.  She underwent stress testing.  The cardiologist involved in her care was  Dr. Patty Lawrence.  The patient also reports having a cardiac  catheterization performed with negative results.  The patient on  discharge was told about her results and was told that her discomfort  was possibly GI or related to esophageal spasms.  This is documented in  the discharge summary.   PAST MEDICAL HISTORY:  Significant for atypical chest pain,  gastroesophageal reflux disease, hypertension, anxiety.  History of a  fracture to the left ankle suffered in October of 2008 per patient  report.   PAST SURGICAL HISTORY:  History of ovarian cancer status post  hysterectomy and bilateral salpingo-oophorectomy, history of a left knee  arthroscopic surgery.   MEDICATIONS AT THIS TIME:  None.   ALLERGIES:  To ASPIRIN which cause anaphylaxis.   SOCIAL HISTORY:  The patient is a smoker.  She is she smokes 1-1/2 packs  of  cigarettes daily and has done so for 30 years.  She is a nondrinker.   FAMILY HISTORY:  Is positive for coronary artery disease in her father,  positive hypertension in both parents, positive for diabetes mellitus in  father, positive for cancer in mother who had ovarian cancer.   REVIEW OF SYSTEMS:  Pertinents are mentioned above.  All other organ  systems are negative.   PHYSICAL EXAMINATION:  GENERAL:  This is a 45 year old overweight female  who is currently in no acute distress.  VITAL SIGNS:  Temperature 98.1, blood pressure 114/79, heart rate 61,  respirations 20, O2 saturations 96-100%.  HEENT: Normocephalic, atraumatic.  Pupils equal round reactive to light.  Extraocular movements are intact, funduscopic benign.  There is no  scleral icterus.  Nares are patent bilaterally.  Tympanic membranes  clear bilaterally.  Oropharynx is clear.  NECK:  Supple full range of motion.  No thyromegaly, adenopathy, jugular  venous distention.  CARDIOVASCULAR:  Regular rate and rhythm.  No murmurs, gallops or rubs.  LUNGS:  Clear to auscultation bilaterally.  ABDOMEN:  Positive bowel sounds, soft, nontender, nondistended.  EXTREMITIES:  Without cyanosis, clubbing or edema.  Please note the  patient was wearing a splint on the left lower extremity.  She states  that she has been wearing this for the past 2 years.  She states that  she has been seen by an orthopedic physician initially, now she states  that she is only being seen currently by workers Agricultural engineer.  She states that she has not seen an orthopedic physician since and  continues to wear her splint on her left lower extremity daily.  The  left lower extremity is without cyanosis, clubbing or edema.  Dorsal  pedal pulses are intact and 2+ of both lower extremities.   LABORATORY STUDIES:  White blood cell count 8.5, hemoglobin 15.1,  hematocrit 43.9, MCV 92.2, platelets 223, neutrophils 66%, lymphocytes  23%.  Sodium 141,  potassium 3.6, chloride 105, carbon dioxide 27, BUN 9,  creatinine 0.74 and glucose 113, calcium 9.5, albumin 4.0, lipase 20.  Point care cardiac markers set #1 with a myoglobin of 34.6, CK-MB less  than 1.0, troponin less than 0.05.  Second set with a myoglobin of 36.9,  CK-MB less than 1.0, troponin less than 0.05, D-dimer 0.47.  Chest x-ray  reveals no acute cardiopulmonary process, central bronchitic markings  are seen.  EKG reveals a normal sinus rhythm without acute ST-segment  changes.   ASSESSMENT:  A 45 year old female being admitted for 23-hour observation  secondary to:  1. Chest pain.  2. History of hypertension.  3. History of gastroesophageal reflux disease.  4. Tobacco abuse.   PLAN:  The patient will be placed in the step-down ICU area since she is  still complaining of having chest discomfort.  The patient will be  medicated with pain medication and placed on Nitropaste, oxygen therapy.  Aspirin therapy will not be given secondary to the patient's reported  history of anaphylaxis with aspirin therapy.  The patient will be placed  on a nicotine patch.  Carafate liquid has also been ordered q.6 h and  the patient will be placed on Bentyl therapy for possible esophageal  spasms.  Further workup will ensue pending results of the patient's  studies and her clinical course.  An x-ray of her left ankle has been  ordered for further evaluation.  Further recommendations will be made  regarding patient's left ankle.  Issues will be deferred to outpatient  management unless there is an acute problem that is seen.      Della Goo, M.D.  Electronically Signed     HJ/MEDQ  D:  09/21/2008  T:  09/22/2008  Job:  [id number]

## 2010-09-09 NOTE — Discharge Summary (Signed)
[name], [name]                ACCOUNT NO.:  [id number]   MEDICAL RECORD NO.:  [id number]          PATIENT TYPE:  INP   LOCATION:  4731                         FACILITY:  [facility]   PHYSICIAN:  Marcellus Scott, MD     DATE OF BIRTH:  [birth year]   DATE OF ADMISSION:  01/29/2008  DATE OF DISCHARGE:  02/01/2008                               DISCHARGE SUMMARY   PRIMARY MEDICAL DOCTOR:  Gentry Fitz.   DISCHARGE DIAGNOSES:  1. Atypical chest pain - negative workup, question secondary to      esophageal spasm.  2. Gastroesophageal reflux disease.  3. Hyperlipidemia.  4. Obesity.  5. Tobacco abuse.  6. Elevated TSH.  7. Question anxiety disorder and depression.   DISCHARGE MEDICATIONS:  1. Xanax 0.25 mg p.o. b.i.d. p.r.n.  2. Propranolol 20 mg p.o. daily.  3..  Protonix 40 mg p.o. daily.   PROCEDURES:  1. Acute abdominal series with chest x-ray.  Impression, bronchitic      changes.  No evidence for focal pulmonary abnormality, been      nonobstructive bowel gas pattern.  2. CT of the abdomen with contrast.  Impression, normal appendix,      bowel wall thickening of the distal ileum and terminal ileum.  3. CT of the pelvis.  Impression is small amount of free fluid in the      pelvis likely related to ileitis, hysterectomy and likely      oophorectomy.  4. CT angiogram of the chest.  Impression:  Negative for pulmonary      embolus, no acute cardiopulmonary disease with bilateral dependent      atelectatic changes noted.  5. Chest x-ray.  Impression, no acute cardiopulmonary findings.  There      is on January 29, 2008.  6. CT of the abdomen without contrast on January 28, 2008.  Impression,      no acute abnormality within the upper abdomen.  Specifically, no      renal calculi.  7. CT of pelvis without contrast on January 28, 2008.  Impression,      abnormal appearance of the distal ileum with fluid collection seen      within the right aspect of the pelvis.  As the appendix was  not      well visualized, followup contrast enhanced examination with      attention of these reasons recommended.   PERTINENT LAB RESULTS:  Lipase 14.  Comprehensive metabolic panel  unremarkable with BUN 9, creatinine 0.7.  Urine microscopy with 3-6  white blood cells and few bacteria.  CBCs were within normal limits.  ESR 13.  Lipid panel with HDL 31, LDL 132, rest unremarkable.  TSH was  5.095, D-dimer was positive at 1.04.   CONSULTATIONS:  Cardiology, Dr. Patty Sermons.   HOSPITAL COURSE AND DISPOSITION:  Ms [name] is a 45 year old female with  history of gastroesophageal reflux disease, status post esophageal  banding secondary to severe reflux, history of ovarian cancer status  post total abdominal hysterectomy and bilateral salpingo-oophorectomy  who presented with chest pain and dyspnea which began 9 a.m. on  the day  of admission.  She was then admitted for further evaluation and  management.  She also was in the emergency room a day prior secondary to  vomiting and diarrhea.   1. Atypical chest pain.  The patient was admitted to telemetry and      extensive workup was done and myocardial infarction was ruled out.      Cardiac catheterization was negative.  CT angiogram of chest was      negative for pulmonary embolism or dissection.  Of note, the      nursing staff indicated that the patient did not look distressed      though she complained of 8-9/10 pain and there was some question of      medication-seeking behavior.  It was explained in detail regarding      workup done which was negative for potentially life-threatening      etiologies and indicated that there is no further need for      inpatient workup.  At this point, the patient became angry, she      started using foul language and saying she should be discharged.      However, within a few minutes, she indicated to the nurse she      wanted to see another MD.  Following this, Dr. Janice Norrie, the      [facility] medical  director was invited to talk to the patient.  He      discussed with her at length and to him she confessed that she was      severely depressed and understood the negative test results and was      agreeable for discharge and wished help obtaining followup medical      care.  The patient was then discharged by Dr. Janice Norrie with medicines      as indicated above.  2. Hyperlipidemia.  3. Obesity.  4. Tobacco abuse.  The patient indicates that she has smoked since age      88 years. The chest pain could have been secondary to acute      bronchitis.  The patient refused to quit, indicating that she will      continue with smoking.  5. Elevated TSH.  To be worked up as an outpatient for hypothyroidism.  6. Question of anxiety and depression.  The patient is to follow up      with PCP and with a possibility of her starting SSRIs as an      outpatient.  Appointment was made.  The patient was provided with      referal to go to [facility] on discharge.      Marcellus Scott, MD  Electronically Signed     AH/MEDQ  D:  03/06/2008  T:  03/07/2008  Job:  [phone] [phone]   cc:   Melvern Banker

## 2010-09-09 NOTE — H&P (Signed)
NAMEVIEVA, BRUMMITT                ACCOUNT NO.:  [id number]   MEDICAL RECORD NO.:  [id number]          PATIENT TYPE:  INP   LOCATION:  4731                         FACILITY:  [facility]   PHYSICIAN:  Della Goo, M.D. DATE OF BIRTH:  [birth year]   DATE OF ADMISSION:  01/29/2008  DATE OF DISCHARGE:                              HISTORY & PHYSICAL   NOTE:  The patient has three different medical accounts.   PRIMARY CARE PHYSICIAN:  Unassigned   CHIEF COMPLAINT:  Chest pain.   HISTORY OF PRESENT ILLNESS:  This is a 45 year old female presenting to  the emergency department with complaints of chest pain and shortness of  breath which started at 9 a.m. on the day of her presentation to the ER.  She reports having chest tightness and rated the pain and discomfort as  being a 10 out 10 at the worst.  This pain was not relieved until  administration of sublingual nitroglycerin x1.  The patient was in the  emergency department 1 day ago secondary to vomiting and diarrhea.  The  patient denies having any cough, congestion, myalgias secondary to chest  pain.   PAST MEDICAL HISTORY:  1. Gastroesophageal reflux disease, status post esophageal banding      secondary to severe reflux.  2. The patient also reports having a history of ovarian cancer in 1999      and underwent a total abdominal hysterectomy with bilateral      salpingo-oophorectomy.  3. The patient also has a surgical history of a hemorrhoidectomy.   MEDICATIONS:  None.   ALLERGIES:  ASPIRIN which causes anaphylaxis.   SOCIAL HISTORY:  The patient lives at home with her son.  She is a  smoker.  Smokes one pack per day for 30+ years and she is a nondrinker.   FAMILY HISTORY:  Positive for mother had ovarian cancer.   REVIEW OF SYSTEMS:  Review of systems pertinents are mentioned above.   PHYSICAL EXAMINATION:  GENERAL:  A 45 year old overweight female in no  visible discomfort or acute distress currently.  VITAL SIGNS:   Temperature 97.3, blood pressure 106/59, heart rate 66,  respirations 17.  Her oxygen saturation initially were 90% on 4 liters.  They have now improved to 98%.  HEENT:  Normocephalic, atraumatic.  Pupils are equally round and  reactive to light.  Extraocular movements are intact.  Funduscopic  benign.  Oropharynx is clear.  NECK:  Supple.  Full range of motion.  No thyromegaly, adenopathy or  jugular venous distention.  CARDIOVASCULAR:  Regular rate and rhythm.  No murmurs, gallops or rubs.  LUNGS:  Clear to auscultation bilaterally.  ABDOMEN:  Positive bowel sounds.  Soft, nontender, nondistended.  EXTREMITIES:  Without cyanosis, clubbing or edema.  NEUROLOGIC:  Nonfocal.   LABORATORY STUDIES:  White blood cell count 8.6, hemoglobin 13.4,  hematocrit 39.9, platelets 203,000, neutrophils 67%, lymphocytes 25%.  Sodium 140, potassium 3.8, chloride 105, bicarb 27, BUN 9, creatinine  0.8, and glucose 95.  Urinalysis reveals trace leukocyte esterase.  D-  dimer equal to 1.04.  CT angiogram study was negative  for a pulmonary  embolism.  Arterial blood gas: pH of 7.543, pCO2 31, pO2 83, bicarb 26.8  and oxygen saturation 98% on 4 liters.  Cardiac enzymes by ISTAT showed  myoglobin 37.3, CK-MB less than 1, troponin less than 0.05.  EKG  findings reveal a normal sinus rhythm without acute ST segment changes.  Chest x-ray negative for any acute disease findings.  However, chronic  bronchitic changes were present.   ASSESSMENT:  A 45 year old female being admitted with:  1. Chest pain.  2. Shortness of breath.  3. Tobacco.  4. Elevated D-dimer   PLAN:  The patient will be admitted to telemetry area.  Cardiac enzymes  will be performed.  The patient will be placed on topical nitrates as  her blood pressure tolerates and supplemental oxygen therapy.  The  patient is severely allergic to aspirin.  This will not be given.  The  patient will be placed on DVT and GI prophylaxis.  Further workup  will  ensue pending the patient's clinical course and results of her cardiac  enzymes.      Della Goo, M.D.  Electronically Signed     HJ/MEDQ  D:  01/30/2008  T:  01/30/2008  Job:  [id number]

## 2011-01-17 ENCOUNTER — Emergency Department (HOSPITAL_COMMUNITY)
Admission: EM | Admit: 2011-01-17 | Discharge: 2011-01-17 | Disposition: A | Discharge status: Home / Self Care | Payer: Self-pay | Attending: Emergency Medicine | Admitting: Emergency Medicine

## 2011-01-17 DIAGNOSIS — F411 Generalized anxiety disorder: Secondary | ICD-10-CM | POA: Insufficient documentation

## 2011-01-17 DIAGNOSIS — I10 Essential (primary) hypertension: Secondary | ICD-10-CM | POA: Insufficient documentation

## 2011-01-17 DIAGNOSIS — K6289 Other specified diseases of anus and rectum: Secondary | ICD-10-CM | POA: Insufficient documentation

## 2011-01-17 DIAGNOSIS — K644 Residual hemorrhoidal skin tags: Secondary | ICD-10-CM | POA: Insufficient documentation

## 2011-01-19 ENCOUNTER — Encounter (INDEPENDENT_AMBULATORY_CARE_PROVIDER_SITE_OTHER): Payer: Self-pay | Admitting: General Surgery

## 2011-01-20 ENCOUNTER — Ambulatory Visit (INDEPENDENT_AMBULATORY_CARE_PROVIDER_SITE_OTHER): Payer: Self-pay | Admitting: Surgery

## 2011-01-20 ENCOUNTER — Encounter (INDEPENDENT_AMBULATORY_CARE_PROVIDER_SITE_OTHER): Payer: Self-pay | Admitting: Surgery

## 2011-01-20 ENCOUNTER — Encounter (INDEPENDENT_AMBULATORY_CARE_PROVIDER_SITE_OTHER): Payer: Self-pay | Admitting: General Surgery

## 2011-01-20 VITALS — BP 132/90 | HR 86 | Ht 64.0 in | Wt 241.0 lb

## 2011-01-20 DIAGNOSIS — K645 Perianal venous thrombosis: Secondary | ICD-10-CM | POA: Insufficient documentation

## 2011-01-20 MED ORDER — OXYCODONE-ACETAMINOPHEN 10-325 MG PO TABS: 1.0000 | ORAL_TABLET | Freq: Four times a day (QID) | ORAL | Status: DC | PRN

## 2011-01-20 NOTE — Progress Notes (Signed)
Subjective:     Patient ID: [name], female   DOB: [birth year], 45 y.o.   MRN: [id number]  HPI Pt seen on 01/17/2011 at [facility] ED due to a 3 day history of rectal pain and bleeding.  Pain severe and located in rectum.  Associated with bleeding. No constipation or diarrhea. No fever or chills or drainage. Sent at the request Jolyne Loa PA .   Review of Systems  Constitutional: Negative.   HENT: Negative.   Eyes: Negative.   Respiratory: Negative.   Cardiovascular: Negative.   Gastrointestinal: Positive for blood in stool and rectal pain.  Genitourinary: Negative.   Musculoskeletal: Negative.   Skin: Negative.   Neurological: Negative.   Hematological: Negative.   Psychiatric/Behavioral: Negative.        Objective:   Physical Exam  Constitutional: She appears well-developed and well-nourished.  HENT:  Head: Normocephalic and atraumatic.  Nose: Nose normal.  Eyes: Conjunctivae and EOM are normal. Pupils are equal, round, and reactive to light.  Neck: Normal range of motion.  Abdominal: Soft. Bowel sounds are normal.  Genitourinary:       THROMBOSED EXTERNAL HEMORRHOID LEFT LATERAL POSITION LARGE TENDER. Small skin tags noted.  Skin: Skin is warm and dry.       Assessment:     Thrombosed external hemorrhoid    Plan:     Incision and drainage in office.  Discussed with the patient treatment options of surgery vs observation or medical.  Risks of bleeding,  Infection  And repeat surgery.  She agreed to incision and drainage.  Under sterile conditions the anoderm was prepped.  1% lidocaine with epi was used an 10 cc injected left lateral anal canal.  Scalpel evacuated clot.  Wound left open.  Dry dressing applied.  Recommend warm tub soaks and analgesia script given Out of work until next Monday.  Questions answered.

## 2011-01-20 NOTE — Patient Instructions (Signed)
Return to work on Monday.  Warm tub soaks 3-4 times a day.  Moist wipes.drink plenty of water.

## 2011-01-21 ENCOUNTER — Telehealth (INDEPENDENT_AMBULATORY_CARE_PROVIDER_SITE_OTHER): Payer: Self-pay | Admitting: General Surgery

## 2011-01-21 NOTE — Telephone Encounter (Signed)
DANA FROM NURSING OFFICE CALLED TO SAY MS Repinski CALLED REQ  SOMETHING STRONGER FOR HER HEMORRHOID PAIN. DR. Luisa Hart ORDERED TRAMADOL 50MG  Q 6HRS PRN PAIN WITH PERCOCET #30 AND LIDOCAINE 2% JELLY APPLY TO RECTUM Q 12 HRS. CALLED TO [facility].  [phone]. PT AWARE.

## 2011-01-21 NOTE — Telephone Encounter (Signed)
Pt called stating pain medication prescribed yesterday was not strong enough. Advised pt dr would be advised and she would be contacted. Info given to [facility]. Pt call back # [phone]. Advised to follow dr orders to soak 3 -4 times daily, keep area clean and drink plenty of water. Advised dr stated in record for her to return to work on Monday. No follow up appt needed unless her condition gets worse or does not improve. Pt was under impression she had stitches and thought they fell out. Advised based on notes in record, stitches were not used for procedure.

## 2011-01-22 LAB — COMPREHENSIVE METABOLIC PANEL
Albumin: 3.9
Alkaline Phosphatase: 88
BUN: 12
Creatinine, Ser: 0.58
Glucose, Bld: 88
Potassium: 4
Total Bilirubin: 0.4
Total Protein: 6.7

## 2011-01-22 LAB — CBC
HCT: 40.8
Hemoglobin: 14.3
MCV: 91.2
Platelets: 255
RDW: 14.6

## 2011-01-22 LAB — POCT CARDIAC MARKERS
Myoglobin, poc: 47
Operator id: 277751
Troponin i, poc: <0.05

## 2011-01-22 LAB — DIFFERENTIAL
Basophils Absolute: 0.1
Basophils Relative: 1
Lymphocytes Relative: 26
Monocytes Absolute: 0.8
Monocytes Relative: 7
Neutro Abs: 7.7
Neutrophils Relative %: 65

## 2011-01-22 LAB — LIPASE, BLOOD: Lipase: 22

## 2011-01-26 LAB — URINALYSIS, ROUTINE W REFLEX MICROSCOPIC
Bilirubin Urine: NEGATIVE
Glucose, UA: NEGATIVE
Glucose, UA: NEGATIVE
Hgb urine dipstick: NEGATIVE
Nitrite: NEGATIVE
Protein, ur: NEGATIVE
Specific Gravity, Urine: 1.009
Specific Gravity, Urine: 1.023
pH: 7

## 2011-01-26 LAB — CBC
HCT: 46.5 — ABNORMAL HIGH
Hemoglobin: 15.3 — ABNORMAL HIGH
MCHC: 32.8
MCHC: 33.8
MCV: 92.8
MCV: 93.8
Platelets: 247
RBC: 4.29
RBC: 4.96
RDW: 13.7
RDW: 13.9
WBC: 13.1 — ABNORMAL HIGH
WBC: 8.6

## 2011-01-26 LAB — POCT I-STAT 3, ART BLOOD GAS (G3+)
O2 Saturation: 98
pCO2 arterial: 31 — ABNORMAL LOW
pH, Arterial: 7.543 — ABNORMAL HIGH
pO2, Arterial: 83

## 2011-01-26 LAB — COMPREHENSIVE METABOLIC PANEL
ALT: 15
ALT: 19
AST: 14
AST: 21
Albumin: 4.1
Alkaline Phosphatase: 80
BUN: 8
Calcium: 8.6
Chloride: 107
Creatinine, Ser: 0.63
GFR calc Af Amer: >60
GFR calc Af Amer: >60
GFR calc non Af Amer: >60
Glucose, Bld: 79
Potassium: 4.2
Sodium: 137
Sodium: 139
Total Protein: 5.6 — ABNORMAL LOW
Total Protein: 6.9

## 2011-01-26 LAB — DIFFERENTIAL
Basophils Absolute: 0
Basophils Relative: 0
Eosinophils Absolute: 0.1
Eosinophils Relative: 1
Lymphocytes Relative: 11 — ABNORMAL LOW
Lymphs Abs: 1.4
Lymphs Abs: 2.1
Monocytes Absolute: 0.6
Monocytes Relative: 5
Monocytes Relative: 7
Neutro Abs: 10.9 — ABNORMAL HIGH
Neutro Abs: 5.7
Neutrophils Relative %: 67
Neutrophils Relative %: 83 — ABNORMAL HIGH

## 2011-01-26 LAB — CARDIAC PANEL(CRET KIN+CKTOT+MB+TROPI)
CK, MB: 1.3
Relative Index: INVALID
Relative Index: INVALID
Total CK: 52
Total CK: 68

## 2011-01-26 LAB — TROPONIN I: Troponin I: 0.04

## 2011-01-26 LAB — POCT CARDIAC MARKERS
CKMB, poc: <1 — ABNORMAL LOW
Myoglobin, poc: 42.5
Troponin i, poc: <0.05

## 2011-01-26 LAB — URINE MICROSCOPIC-ADD ON

## 2011-01-26 LAB — POCT I-STAT, CHEM 8
Creatinine, Ser: 0.8
Hemoglobin: 13.6
Potassium: 3.8
Sodium: 140

## 2011-01-26 LAB — COMPREHENSIVE METABOLIC PANEL WITH GFR
CO2: 23
Calcium: 9.4
Creatinine, Ser: 0.65
GFR calc non Af Amer: >60
Glucose, Bld: 96
Total Bilirubin: 0.6

## 2011-01-26 LAB — LIPASE, BLOOD: Lipase: 18

## 2011-01-26 LAB — LIPID PANEL
Cholesterol: 180
HDL: 31 — ABNORMAL LOW
Total CHOL/HDL Ratio: 5.8
Triglycerides: 87

## 2011-01-26 LAB — CK TOTAL AND CKMB (NOT AT ARMC)
CK, MB: 0.9
Relative Index: INVALID
Total CK: 58

## 2011-01-26 LAB — PROTIME-INR: INR: 0.9

## 2011-01-26 LAB — TSH: TSH: 5.095 — ABNORMAL HIGH

## 2011-01-27 LAB — COMPREHENSIVE METABOLIC PANEL
AST: 16
Albumin: 3.7
Alkaline Phosphatase: 75
BUN: 9
Chloride: 100
GFR calc Af Amer: >60
Potassium: 3.6
Total Bilirubin: 0.5
Total Protein: 6.1

## 2011-01-27 LAB — DIFFERENTIAL
Basophils Relative: 0
Eosinophils Relative: 0
Lymphs Abs: 0.8
Monocytes Absolute: 0.6
Monocytes Relative: 6
Neutro Abs: 8.9 — ABNORMAL HIGH

## 2011-01-27 LAB — URINALYSIS, ROUTINE W REFLEX MICROSCOPIC
Bilirubin Urine: NEGATIVE
Glucose, UA: NEGATIVE
Hgb urine dipstick: NEGATIVE
Ketones, ur: NEGATIVE
Nitrite: NEGATIVE
Specific Gravity, Urine: 1.023
pH: 7

## 2011-01-27 LAB — CBC
HCT: 43.1
MCHC: 33.8
MCV: 91.7
RBC: 4.7
WBC: 10.3

## 2011-01-27 LAB — URINE MICROSCOPIC-ADD ON

## 2011-01-28 LAB — CBC
HCT: 44.6
MCHC: 33.8
MCV: 92.5
Platelets: 242
RBC: 4.82
WBC: 10.1

## 2011-01-28 LAB — COMPREHENSIVE METABOLIC PANEL
BUN: 10
CO2: 26
Calcium: 9.6
Chloride: 105
Creatinine, Ser: 0.74
GFR calc non Af Amer: >60
Total Bilirubin: 0.4

## 2011-01-28 LAB — DIFFERENTIAL
Basophils Absolute: 0
Basophils Relative: 0
Eosinophils Relative: 2
Lymphocytes Relative: 22
Neutro Abs: 7

## 2011-01-28 LAB — URINALYSIS, ROUTINE W REFLEX MICROSCOPIC
Bilirubin Urine: NEGATIVE
Nitrite: NEGATIVE
Protein, ur: NEGATIVE
Specific Gravity, Urine: 1.013
Urobilinogen, UA: 0.2

## 2011-01-28 LAB — LIPASE, BLOOD: Lipase: 19

## 2011-01-28 LAB — URINE MICROSCOPIC-ADD ON

## 2011-03-03 ENCOUNTER — Encounter (INDEPENDENT_AMBULATORY_CARE_PROVIDER_SITE_OTHER): Payer: Self-pay | Admitting: General Surgery

## 2011-03-03 ENCOUNTER — Ambulatory Visit (INDEPENDENT_AMBULATORY_CARE_PROVIDER_SITE_OTHER): Payer: Self-pay | Admitting: General Surgery

## 2011-03-03 DIAGNOSIS — K648 Other hemorrhoids: Secondary | ICD-10-CM | POA: Insufficient documentation

## 2011-03-03 DIAGNOSIS — K645 Perianal venous thrombosis: Secondary | ICD-10-CM

## 2011-03-03 MED ORDER — HYDROCORTISONE 2.5 % RE CREA: TOPICAL_CREAM | Freq: Three times a day (TID) | RECTAL | Status: AC

## 2011-03-03 MED ORDER — HYDROCODONE-ACETAMINOPHEN 5-500 MG PO TABS
1.0000 | ORAL_TABLET | ORAL | Status: AC | PRN
Start: 2011-03-03 — End: 2012-03-02

## 2011-03-03 NOTE — Patient Instructions (Signed)
Hemorrhoids Hemorrhoids are enlarged (dilated) veins around the rectum. There are 2 types of hemorrhoids, and the type of hemorrhoid is determined by its location. Internal hemorrhoids occur in the veins just inside the rectum.They are usually not painful, but they may bleed.However, they may poke through to the outside and become irritated and painful. External hemorrhoids involve the veins outside the anus and can be felt as a painful swelling or hard lump near the anus.They are often itchy and may crack and bleed. Sometimes clots will form in the veins. This makes them swollen and painful. These are called thrombosed hemorrhoids. CAUSES Causes of hemorrhoids include:  Pregnancy. This increases the pressure in the hemorrhoidal veins.   Constipation.   Straining to have a bowel movement.   Obesity.   Heavy lifting or other activity that caused you to strain.  TREATMENT  Most of the time hemorrhoids improve in 1 to 2 weeks. However, if symptoms do not seem to be getting better or if you have a lot of rectal bleeding, your caregiver may perform a procedure to help make the hemorrhoids get smaller or remove them completely.  Take warm sitz baths for 20 to 30 minutes, 3 to 4 times per day.   Use Anusol HC three times per day  Take stool softener twice a day.  Add miralax once per day.  Take dulcolax or other over the counter laxative daily as needed for soft BMs.  Increase fiber in your diet. Ask your caregiver about using fiber supplements.   Drink enough water and fluids to keep your urine clear or pale yellow.   Exercise regularly.   Go to the bathroom when you have the urge to have a bowel movement. Do not wait.   Avoid straining to have bowel movements.   Keep the anal area dry and clean.   Only take over-the-counter or prescription medicines for pain, discomfort, or fever as directed by your caregiver.  If the hemorrhoids are very tender and swollen, place ice packs  on the area as tolerated. Using ice packs between sitz baths may be helpful. Fill a plastic bag with ice. Place a towel between the bag of ice and your skin.   Medicated creams and suppositories may be used or applied as directed.   Do not use a donut-shaped pillow or sit on the toilet for long periods. This increases blood pooling and pain.   SEEK MEDICAL CARE IF:   You have increasing pain and swelling that is not controlled with your medicine.   You have uncontrolled bleeding.   You have difficulty or you are unable to have a bowel movement.   You have pain or inflammation outside the area of the hemorrhoids.   You have chills or an oral temperature above 102 F (38.9 C).   MAKE SURE YOU:   Understand these instructions.   Will watch your condition.   Will get help right away if you are not doing well or get worse.

## 2011-03-03 NOTE — Assessment & Plan Note (Addendum)
Resolved

## 2011-03-03 NOTE — Progress Notes (Signed)
Chief Complaint  Patient presents with  . Other    urgent- eval thromb hems    HISTORY: Pt is s/p excision of thrombosed hemorrhoid 6 weeks ago with Dr. Luisa Hart.  She started having more bleeding from the anus over the last week.  She has been taking stool softeners daily, but still sometimes has to strain to have bowel movements and sometimes has to sit on the toilet for a while.  She has noticed swelling and pain, and she has difficulty cleaning.    Past Medical History  Diagnosis Date  . Hiatal hernia   . Anxiety   . Hemorrhoids     Past Surgical History  Procedure Date  . Abdominal hysterectomy 1999  . Hemorrhoid surgery 1999  . Knee surgery 20 yrs ago    right    No current outpatient prescriptions on file.     Allergies  Allergen Reactions  . Aspirin Anaphylaxis     Family History  Problem Relation Age of Onset  . Stomach cancer Mother   . Diabetes Father      History   Social History  . Marital Status: Single    Spouse Name: N/A    Number of Children: N/A  . Years of Education: N/A   Social History Main Topics  . Smoking status: Current Everyday Smoker -- 0.8 packs/day  . Smokeless tobacco: None  . Alcohol Use: No  . Drug Use: None  . Sexually Active: None   Other Topics Concern  . None   Social History Narrative  . None     REVIEW OF SYSTEMS - PERTINENT POSITIVES ONLY: 12 point review of systems negative other than HPI and PMH. EXAM: Filed Vitals:   03/03/11 1630  BP: 138/72  Pulse: 60  Temp: 96.9 F (36.1 C)  Resp: 16    Gen:  No acute distress.  Well nourished and well groomed.   Neurological: Alert and oriented to person, place, and time. Coordination normal.  Head: Normocephalic and atraumatic.  Eyes: Conjunctivae are normal. Pupils are equal, round, and reactive to light. No scleral icterus.  Rectal:  Pt with circumferential external hemorrhoids which are inflamed.  Digital exam reveals no masses.  Anoscopy reveals  circumferential internal hemorrhoids which are inflamed and are injected with sclerosant.   Respiratory: Effort normal.  No respiratory distress.  Musculoskeletal: Normal range of motion. Extremities are nontender.  Skin: Skin is warm and dry. No rash noted. No diaphoresis. No erythema. No pallor. No clubbing, cyanosis, or edema.   Psychiatric: Normal mood and affect. Behavior is normal. Judgment and thought content normal.   ASSESSMENT AND PLAN:   Hemorrhoids thrombosed Resolved.   Hemorrhoids, internal and external, with bleeding Internal hemorrhoids injected circumferentially. Sitz baths BID and prn. Aggressive control of constipation. Anusol HC Avoid prolonged time on the toilet.   Follow up with me or Dr. Luisa Hart in 6 weeks.          Maudry Diego MD Surgical Oncology, General and Endocrine Surgery [facility]      Visit Diagnoses: 1. Hemorrhoids thrombosed   2. Hemorrhoids, internal and external, with bleeding     Primary Care Physician: No primary provider on file.

## 2011-03-03 NOTE — Assessment & Plan Note (Signed)
Internal hemorrhoids injected circumferentially. Sitz baths BID and prn. Aggressive control of constipation. Anusol HC Avoid prolonged time on the toilet.   Follow up with me or Dr. Luisa Hart in 6 weeks.

## 2011-03-04 ENCOUNTER — Encounter (INDEPENDENT_AMBULATORY_CARE_PROVIDER_SITE_OTHER): Payer: Self-pay

## 2011-03-05 ENCOUNTER — Encounter (INDEPENDENT_AMBULATORY_CARE_PROVIDER_SITE_OTHER): Payer: Self-pay | Admitting: General Surgery

## 2011-03-05 ENCOUNTER — Telehealth (INDEPENDENT_AMBULATORY_CARE_PROVIDER_SITE_OTHER): Payer: Self-pay | Admitting: General Surgery

## 2011-03-05 NOTE — Telephone Encounter (Signed)
That is fine 

## 2011-03-05 NOTE — Telephone Encounter (Signed)
Note written and faxed to patient's job per Dr Donell Beers. Patient made aware.

## 2011-03-05 NOTE — Telephone Encounter (Signed)
Patient called to get a note to stay out of work for the rest of the week. States she can not sit after having internal hemorrhoids injected 2 days ago and she drives a truck. Wants a note faxed to her job at [phone]. Please advise.

## 2011-06-06 ENCOUNTER — Telehealth (INDEPENDENT_AMBULATORY_CARE_PROVIDER_SITE_OTHER): Payer: Self-pay | Admitting: Surgery

## 2011-06-06 NOTE — Telephone Encounter (Signed)
Pt called with rectal bleeding moderate.  BM3-4/day.  No major pain.  Worried hems are worse.  Not dizzy/lightheaded.  Rec slow BM down w Pepto, sit on ice pack vs warm baths.  Don't overwipe.   Prob needs to be seen by Dr. Donell Beers again to consider more aggressive Tx.  Had injections in past.

## 2011-12-01 DIAGNOSIS — R011 Cardiac murmur, unspecified: Secondary | ICD-10-CM | POA: Insufficient documentation

## 2012-02-25 DIAGNOSIS — K449 Diaphragmatic hernia without obstruction or gangrene: Secondary | ICD-10-CM | POA: Insufficient documentation

## 2012-03-14 DIAGNOSIS — K760 Fatty (change of) liver, not elsewhere classified: Secondary | ICD-10-CM

## 2012-03-14 DIAGNOSIS — K5790 Diverticulosis of intestine, part unspecified, without perforation or abscess without bleeding: Secondary | ICD-10-CM

## 2012-03-14 DIAGNOSIS — I7090 Unspecified atherosclerosis: Secondary | ICD-10-CM | POA: Insufficient documentation

## 2012-03-14 HISTORY — DX: Fatty (change of) liver, not elsewhere classified: K76.0

## 2012-03-14 HISTORY — DX: Diverticulosis of intestine, part unspecified, without perforation or abscess without bleeding: K57.90

## 2012-08-01 DIAGNOSIS — E785 Hyperlipidemia, unspecified: Secondary | ICD-10-CM

## 2012-08-01 HISTORY — DX: Hyperlipidemia, unspecified: E78.5

## 2014-03-14 DIAGNOSIS — F419 Anxiety disorder, unspecified: Secondary | ICD-10-CM | POA: Insufficient documentation

## 2014-04-06 DIAGNOSIS — G4733 Obstructive sleep apnea (adult) (pediatric): Secondary | ICD-10-CM

## 2014-04-06 HISTORY — DX: Obstructive sleep apnea (adult) (pediatric): G47.33

## 2019-06-01 DIAGNOSIS — N3946 Mixed incontinence: Secondary | ICD-10-CM

## 2019-06-01 HISTORY — DX: Mixed incontinence: N39.46

## 2019-12-22 ENCOUNTER — Other Ambulatory Visit: Payer: Self-pay

## 2019-12-22 ENCOUNTER — Emergency Department (HOSPITAL_BASED_OUTPATIENT_CLINIC_OR_DEPARTMENT_OTHER): Payer: BLUE CROSS/BLUE SHIELD

## 2019-12-22 ENCOUNTER — Encounter (HOSPITAL_BASED_OUTPATIENT_CLINIC_OR_DEPARTMENT_OTHER): Payer: Self-pay | Admitting: *Deleted

## 2019-12-22 ENCOUNTER — Emergency Department (HOSPITAL_BASED_OUTPATIENT_CLINIC_OR_DEPARTMENT_OTHER)
Admission: EM | Admit: 2019-12-22 | Discharge: 2019-12-22 | Disposition: A | Discharge status: Home / Self Care | Payer: BLUE CROSS/BLUE SHIELD | Attending: Emergency Medicine | Admitting: Emergency Medicine

## 2019-12-22 DIAGNOSIS — Y9389 Activity, other specified: Secondary | ICD-10-CM | POA: Insufficient documentation

## 2019-12-22 DIAGNOSIS — F17200 Nicotine dependence, unspecified, uncomplicated: Secondary | ICD-10-CM | POA: Diagnosis not present

## 2019-12-22 DIAGNOSIS — S8391XA Sprain of unspecified site of right knee, initial encounter: Secondary | ICD-10-CM | POA: Insufficient documentation

## 2019-12-22 DIAGNOSIS — M25461 Effusion, right knee: Secondary | ICD-10-CM | POA: Insufficient documentation

## 2019-12-22 DIAGNOSIS — Y998 Other external cause status: Secondary | ICD-10-CM | POA: Diagnosis not present

## 2019-12-22 DIAGNOSIS — S8991XA Unspecified injury of right lower leg, initial encounter: Secondary | ICD-10-CM | POA: Diagnosis present

## 2019-12-22 DIAGNOSIS — Y9289 Other specified places as the place of occurrence of the external cause: Secondary | ICD-10-CM | POA: Insufficient documentation

## 2019-12-22 NOTE — ED Notes (Signed)
Pt reports getting out of large truck 2 weeks ago and hurting right knee, unable to get in with ortho until the end of the month.

## 2019-12-22 NOTE — ED Provider Notes (Signed)
[facility] EMERGENCY DEPARTMENT Provider Note   CSN: [id number] Arrival date & time: 12/22/19  1607     History Chief Complaint  Patient presents with  . Knee Pain    [name] is a 54 y.o. female.  54 year old female presents with complaint of right knee pain.  Patient states she is a [employer], was stepping out of her regular 3 weeks ago when she twisted her knee.  Patient went to her PCP and is awaiting referral to orthopedics however this is taken 3 weeks and so patient was sent to the emergency room for further evaluation.  Patient requesting knee immobilizer, is using a cane to ambulate, reports generalized knee pain and instability.  No other complaints or concerns.        Past Medical History:  Diagnosis Date  . Anxiety   . Hemorrhoids   . Hiatal hernia     Patient Active Problem List   Diagnosis Date Noted  . Hemorrhoids, internal and external, with bleeding 03/03/2011  . Hemorrhoids thrombosed 01/20/2011    Past Surgical History:  Procedure Laterality Date  . ABDOMINAL HYSTERECTOMY  1999  . HEMORRHOID SURGERY  1999  . KNEE SURGERY  20 yrs ago   right     OB History   No obstetric history on file.     Family History  Problem Relation Age of Onset  . Stomach cancer Mother   . Diabetes Father     Social History   Tobacco Use  . Smoking status: Current Every Day Smoker    Packs/day: 0.80  Substance Use Topics  . Alcohol use: No  . Drug use: Not on file    Home Medications Prior to Admission medications   Not on File    Allergies    Aspirin  Review of Systems   Review of Systems  Constitutional: Negative for fever.  Musculoskeletal: Positive for arthralgias, gait problem and joint swelling.  Skin: Negative for color change, rash and wound.  Allergic/Immunologic: Negative for immunocompromised state.  Neurological: Negative for weakness and numbness.  Psychiatric/Behavioral: Negative for confusion.    Physical  Exam Updated Vital Signs BP (!) 164/85   Pulse 80   Resp 18   Ht 5\' 3"  (1.6 m)   Wt 131.1 kg   BMI 51.19 kg/m   Physical Exam Vitals and nursing note reviewed.  Constitutional:      General: She is not in acute distress.    Appearance: She is well-developed. She is not diaphoretic.  HENT:     Head: Normocephalic and atraumatic.  Cardiovascular:     Pulses: Normal pulses.  Pulmonary:     Effort: Pulmonary effort is normal.  Musculoskeletal:        General: Swelling and tenderness present. No deformity.     Right knee: Effusion present. No deformity. Decreased range of motion. Tenderness present. Normal pulse.     Left knee: Normal.     Right lower leg: No edema.     Left lower leg: No edema.     Comments: Generalized tenderness to the generalized tenderness to the anterior right knee with effusion, no overlying erythema, calves are soft and nontender, DP pulses present bilaterally.  Patient's leg is held in slight flexion, has pain with full extension or flexion.  Skin:    General: Skin is warm and dry.     Findings: No erythema or rash.  Neurological:     Mental Status: She is alert and oriented to  person, place, and time.     Sensory: No sensory deficit.  Psychiatric:        Behavior: Behavior normal.     ED Results / Procedures / Treatments   Labs (all labs ordered are listed, but only abnormal results are displayed) Labs Reviewed - No data to display  EKG None  Radiology DG Knee Complete 4 Views Right  Result Date: 12/22/2019 CLINICAL DATA:  Right knee pain x3 weeks. EXAM: RIGHT KNEE - COMPLETE 4+ VIEW COMPARISON:  May 23, 2005 FINDINGS: No evidence of an acute fracture or dislocation. Mild degenerative changes are seen involving the distal right femur and proximal right tibia. A small to moderate sized joint effusion is seen. IMPRESSION: Mild degenerative changes with a small to moderate sized joint effusion. Electronically Signed   By: Aram Candela M.D.    On: 12/22/2019 16:49    Procedures Procedures (including critical care time)  Medications Ordered in ED Medications - No data to display  ED Course  I have reviewed the triage vital signs and the nursing notes.  Pertinent labs & imaging results that were available during my care of the patient were reviewed by me and considered in my medical decision making (see chart for details).  Clinical Course as of Dec 22 1739  Fri Dec 22, 2019  [id number] 54 year old 3 weeks ago.  Patient has been unable to see Ortho and was sent to the ER for further evaluation by PCP.  On exam has an effusion with generalized anterior tenderness.  X-ray shows effusion.  Patient will be referred to sports medicine, states that she is about to take a 9-day drive with work and will schedule appointment for when she returns.  Patient will be given knee immobilizer as requested.   [LM]    Clinical Course User Index [LM] Alden Hipp   MDM Rules/Calculators/A&P                          Final Clinical Impression(s) / ED Diagnoses Final diagnoses:  Effusion of right knee  Sprain of right knee, unspecified ligament, initial encounter    Rx / DC Orders ED Discharge Orders    None       Jeannie Fend, PA-C 12/22/19 1741    Sabas Sous, MD 12/22/19 [phone]

## 2019-12-22 NOTE — ED Triage Notes (Signed)
Pt c/o right knee injury x 1 week ago

## 2019-12-29 ENCOUNTER — Telehealth: Payer: Self-pay | Admitting: Family Medicine

## 2019-12-29 NOTE — Telephone Encounter (Signed)
Called pt to offer ED follow- up care for knee pain --unable to leave msg ,no answer--will try again later.  --glh

## 2021-08-14 DIAGNOSIS — R609 Edema, unspecified: Secondary | ICD-10-CM

## 2021-08-14 DIAGNOSIS — R0609 Other forms of dyspnea: Secondary | ICD-10-CM

## 2021-08-14 DIAGNOSIS — I739 Peripheral vascular disease, unspecified: Secondary | ICD-10-CM | POA: Insufficient documentation

## 2021-08-14 DIAGNOSIS — R600 Localized edema: Secondary | ICD-10-CM

## 2021-08-14 HISTORY — DX: Other forms of dyspnea: R06.09

## 2021-08-14 HISTORY — DX: Localized edema: R60.0

## 2021-12-15 ENCOUNTER — Telehealth: Payer: Self-pay | Admitting: *Deleted

## 2021-12-15 NOTE — Telephone Encounter (Signed)
Attempted to reach the patient to schedule a new patient appt with Dr Tucker; LMOM to call the office back  

## 2021-12-16 ENCOUNTER — Telehealth: Payer: Self-pay

## 2021-12-16 NOTE — Telephone Encounter (Signed)
Pt called today to schedule a new pt appointment. We scheduled her for Thursday 8/31. In giving the pt the address she asks if she can go to [facility] in [facility] because it is closer to her.  I advised her to notify the referring Dr. So they could send the referral to the office of her choice. She voiced an understanding.   Pt cancelled appointment with Dr. Pricilla Holm.  [facility] Dr. Camillia Herter office notified by me.

## 2021-12-16 NOTE — Telephone Encounter (Signed)
Spoke with [name] regarding her referral to GYN oncology. She has an appointment scheduled with Dr. Pricilla Holm on 01/01/22 at 9:45am. Patient agrees to date and time. She has been provided with office address and location. She is also aware of our mask and visitor policy. Patient verbalized understanding and will call with any questions.

## 2021-12-25 ENCOUNTER — Ambulatory Visit: Payer: BLUE CROSS/BLUE SHIELD | Admitting: Gynecologic Oncology

## 2021-12-31 NOTE — H&P (View-Only) (Signed)
GYNECOLOGIC ONCOLOGY NEW PATIENT CONSULTATION   Patient Name: [name]  Patient Age: 56 y.o. Date of Service: 01/01/22 Referring Provider: Shea Evans, MD  Primary Care Provider: Mattie Marlin, MD Consulting Provider: Eugene Garnet, MD   Assessment/Plan:  Postmenopausal patient with high-grade vulvar dysplasia.  We discussed her recent biopsy showing vulvar dysplasia.  We discussed the role of HPV in the pathogenesis of the majority of cervical, vulvar and vaginal dysplasia.   With regard to her high-grade vulvar dysplasia, given increased risk of progression to cancer, I recommend proceeding with treatment.  The biopsy showed positive margins and visually there is high-grade dysplasia that remains.  We discussed options for surgical treatment (excision and laser ablation) as well as topical therapy.  I think the area involved is amenable to excision.  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  This is a larger area and in a location that can be somewhat challenging to excise.  I will plan to have the laser available for laser ablation of this area after I take biopsies.    We discussed the risk of recurrence after treatment and the need for long-term surveillance with her OBGYN.    Given HPV driven process, recommended vaginal Pap and HPV testing today.  This was performed.   Tobacco is an established HPV cofactor in the development of dysplasia and malignancy.  Stressed the importance of decreasing tobacco use or quitting completely.  We discussed the plan for EUA, possible vulvar biopsies, partial simple vulvectomy, possible laser ablation, and any other indicated procedures. We discussed risks of surgery which included but are not limited to bleeding, need for blood transfusion, infection, damage to surrounding structure, anesthesia risk thromboembolic events, unforeseen complications, and medical complications. We discussed the risk of wound dehiscence given  its location. The patient will receive DVT and antibiotic prophylaxis as indicated. She voiced a clear understanding. She had the opportunity to ask questions. Perioperative instructions were reviewed with her. Prescriptions for post-op medications were sent to her pharmacy of choice  Given her family history, I recommended referral to genetics.  This referral was placed today.  A copy of this note was sent to the patient's referring provider.   65 minutes of total time was spent for this patient encounter, including preparation, face-to-face counseling with the patient and coordination of care, and documentation of the encounter.   Eugene Garnet, MD  Division of Gynecologic Oncology  Department of Obstetrics and Gynecology  [facility]  ___________________________________________  Chief Complaint: No chief complaint on file.   History of Present Illness:  [name] is a 56 y.o. y.o. female who is seen in consultation at the request of Dr. Juliene Pina for an evaluation of high-grade vulvar dysplasia.  Patient was seen in July.  At that time, she was noted to have a 3 cm lower left labial lesion, 1 cm below the intergluteal fold, with skin disruption.  She was also noted to have a left labial lesion.  She then returned on 8/10 for biopsy of left labia majora lesion.  Lesion was completely removed in clinic.  This revealed high-grade vulvar squamous intraepithelial lesion, both lateral margins involved.  Patient overall reports doing well.  She notes that the lesion biopsied has been there for approximately 1 year, has been growing slowly over time.  She has symptoms including pain, irritation related to clothing, and pruritus.  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  He denies any associated bleeding or discharge.  Patient was diagnosed with H. pylori about a year ago.  She has been treated intermittently.  She has had some  early satiety and bloating related to this that is overall significantly better than it had been.  She endorses a good appetite.  She endorses normal bowel function.  She has both urinary urgency as well as urinary urge incontinence and some stress urinary incontinence.  GYN history is notable for a hysterectomy in 1999 for bleeding.  Tubes and ovaries were removed at that time per her report.  Family history is notable for ovarian cancer in her mother and colon cancer in her maternal grandmother.  Patient has a history of COPD.  She endorses smoking 1 pack of cigarettes per day, down from 3 packs/day previously.  She is not interested in stopping.  She uses an albuterol inhaler as needed, which she describes as once or twice every 6 months.  She has shortness of breath with any sort of activity, denies any shortness of breath or chest pain at rest.  PAST MEDICAL HISTORY:  Past Medical History:  Diagnosis Date   Anxiety    BMI 50.0-59.9, adult (HCC)    COPD (chronic obstructive pulmonary disease) (HCC)    Diverticulosis 03/14/2012   Dyspnea on exertion 08/14/2021   Fatty liver disease, nonalcoholic 03/14/2012   H. 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   OSA (obstructive sleep apnea) 04/06/2014   Peripheral edema 08/14/2021     PAST SURGICAL HISTORY:  Past Surgical History:  Procedure Laterality Date   ABDOMINAL HYSTERECTOMY  04/27/1997   BSO too   DILATION AND CURETTAGE OF UTERUS     for bleeding work-up before hysterectomy   HEMORRHOID SURGERY  04/27/1997   KNEE SURGERY  20 yrs ago   right    OB/GYN HISTORY:  OB History  Gravida Para Term Preterm AB Living  1 1     0 1  SAB IAB Ectopic Multiple Live Births               # Outcome Date GA Lbr Len/2nd Weight Sex Delivery Anes PTL Lv  1 Para             No LMP recorded.  Age at menarche: 17 Age at menopause: at the time of her  hysterectomy in 1999 Hx of HRT: denies Hx of STDs: denies Last pap: 2017 - normal per patient History of abnormal pap smears: denies  SCREENING STUDIES:  Last mammogram: 2023  Last colonoscopy: 2019  MEDICATIONS: No outpatient encounter medications on file as of 01/01/2022.   No facility-administered encounter medications on file as of 01/01/2022.    ALLERGIES:  Allergies  Allergen Reactions   Aspirin Anaphylaxis     FAMILY HISTORY:  Family History  Problem Relation Age of Onset   Ovarian cancer Mother    Diabetes Father    Colon cancer Maternal Grandmother      SOCIAL HISTORY:  Social Connections: Not on file    REVIEW OF SYSTEMS:  + leg swelling, join pain, back pain Denies appetite changes, fevers, chills, fatigue, unexplained weight changes. Denies hearing loss, neck lumps or masses, mouth sores, ringing in ears or voice changes. Denies cough or wheezing.  Denies shortness of breath. Denies chest pain or palpitations.  Denies abdominal distention, pain, blood in stools,  constipation, diarrhea, nausea, vomiting, or early satiety. Denies pain with intercourse, dysuria, frequency, hematuria or incontinence. Denies hot flashes, pelvic pain, vaginal bleeding or vaginal discharge.   Denies muscle pain/cramps. Denies itching, rash, or wounds. Denies dizziness, headaches, numbness or seizures. Denies swollen lymph nodes or glands, denies easy bruising or bleeding. Denies anxiety, depression, confusion, or decreased concentration.  Physical Exam:  Vital Signs for this encounter:  Blood pressure 137/65, pulse 70, temperature 98.4 F (36.9 C), temperature source Oral, resp. rate 18, height 5' 1.42" (1.56 m), weight 280 lb 9.6 oz (127.3 kg), SpO2 98 %. Body mass index is 52.3 kg/m. General: Alert, oriented, no acute distress.  HEENT: Normocephalic, atraumatic. Sclera anicteric.  Chest: Clear to auscultation bilaterally. No wheezes, rhonchi, or rales. Cardiovascular:  Regular rate and rhythm, no murmurs, rubs, or gallops.  Abdomen: Obese. Normoactive bowel sounds. Soft, nondistended, nontender to palpation. No masses or hepatosplenomegaly appreciated. No palpable fluid wave.  Extremities: Grossly normal range of motion. Warm, well perfused. Trace edema bilaterally.  Skin: No rashes or lesions.  Lymphatics: No cervical, supraclavicular, or inguinal adenopathy.  GU:  External genitalia notable for an approximately 3 x 1 cm lesion along the left inferior vulva, prior suture seen.  The edges of this lesion are raised, midportion looks mildly ulcerated.  On remainder of the vulvar exam, there is an approximately 3 x 2 cm area of leukoplakia along the left inner labia.  No other vulvar lesions noted.             Bladder/urethra:  No lesions or masses.             Vagina: Well rugated, no lesions or masses noted.             Cervix/uterus: Surgically absent.  Pap and HPV collected.             Adnexa: No masses appreciated.  LABORATORY AND RADIOLOGIC DATA:  Outside medical records were reviewed to synthesize the above history, along with the history and physical obtained during the visit.   Lab Results  Component Value Date   WBC 9.6 09/08/2010   HGB 14.3 09/08/2010   HCT 42.2 09/08/2010   PLT 188 09/08/2010   GLUCOSE 108 (H) 09/08/2010   CHOL  11/20/2008    173        ATP III CLASSIFICATION:  <200     mg/dL   Desirable  454-098  mg/dL   Borderline High  >=119    mg/dL   High          TRIG 83 11/20/2008   HDL 38 (L) 11/20/2008   LDLCALC (H) 11/20/2008    118        Total Cholesterol/HDL:CHD Risk Coronary Heart Disease Risk Table                     Men   Women  1/2 Average Risk   3.4   3.3  Average Risk       5.0   4.4  2 X Average Risk   9.6   7.1  3 X Average Risk  23.4   11.0        Use the calculated Patient Ratio above and the CHD Risk Table to determine the patient's CHD Risk.        ATP III CLASSIFICATION (LDL):  <100     mg/dL    Optimal  147-829  mg/dL   Near or Above  Optimal  130-159  mg/dL   Borderline  417-408  mg/dL   High  >144     mg/dL   Very High   ALT 18 81/85/6314   AST 16 09/08/2010   NA 137 09/08/2010   K 3.8 09/08/2010   CL 101 09/08/2010   CREATININE 0.60 09/08/2010   BUN 11 09/08/2010   CO2 24 09/08/2010   TSH 2.474 02/15/2009   INR 1.0 11/20/2008

## 2021-12-31 NOTE — Progress Notes (Signed)
GYNECOLOGIC ONCOLOGY NEW PATIENT CONSULTATION   Patient Name: [name]  Patient Age: 56 y.o. Date of Service: 01/01/22 Referring Provider: Shea Evans, MD  Primary Care Provider: Mattie Marlin, MD Consulting Provider: Eugene Garnet, MD   Assessment/Plan:  Postmenopausal patient with high-grade vulvar dysplasia.  We discussed her recent biopsy showing vulvar dysplasia.  We discussed the role of HPV in the pathogenesis of the majority of cervical, vulvar and vaginal dysplasia.   With regard to her high-grade vulvar dysplasia, given increased risk of progression to cancer, I recommend proceeding with treatment.  The biopsy showed positive margins and visually there is high-grade dysplasia that remains.  We discussed options for surgical treatment (excision and laser ablation) as well as topical therapy.  I think the area involved is amenable to excision.  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  This is a larger area and in a location that can be somewhat challenging to excise.  I will plan to have the laser available for laser ablation of this area after I take biopsies.    We discussed the risk of recurrence after treatment and the need for long-term surveillance with her OBGYN.    Given HPV driven process, recommended vaginal Pap and HPV testing today.  This was performed.   Tobacco is an established HPV cofactor in the development of dysplasia and malignancy.  Stressed the importance of decreasing tobacco use or quitting completely.  We discussed the plan for EUA, possible vulvar biopsies, partial simple vulvectomy, possible laser ablation, and any other indicated procedures. We discussed risks of surgery which included but are not limited to bleeding, need for blood transfusion, infection, damage to surrounding structure, anesthesia risk thromboembolic events, unforeseen complications, and medical complications. We discussed the risk of wound dehiscence given  its location. The patient will receive DVT and antibiotic prophylaxis as indicated. She voiced a clear understanding. She had the opportunity to ask questions. Perioperative instructions were reviewed with her. Prescriptions for post-op medications were sent to her pharmacy of choice  Given her family history, I recommended referral to genetics.  This referral was placed today.  A copy of this note was sent to the patient's referring provider.   65 minutes of total time was spent for this patient encounter, including preparation, face-to-face counseling with the patient and coordination of care, and documentation of the encounter.   Eugene Garnet, MD  Division of Gynecologic Oncology  Department of Obstetrics and Gynecology  [facility]  ___________________________________________  Chief Complaint: No chief complaint on file.   History of Present Illness:  [name] is a 56 y.o. y.o. female who is seen in consultation at the request of Dr. Juliene Pina for an evaluation of high-grade vulvar dysplasia.  Patient was seen in July.  At that time, she was noted to have a 3 cm lower left labial lesion, 1 cm below the intergluteal fold, with skin disruption.  She was also noted to have a left labial lesion.  She then returned on 8/10 for biopsy of left labia majora lesion.  Lesion was completely removed in clinic.  This revealed high-grade vulvar squamous intraepithelial lesion, both lateral margins involved.  Patient overall reports doing well.  She notes that the lesion biopsied has been there for approximately 1 year, has been growing slowly over time.  She has symptoms including pain, irritation related to clothing, and pruritus.  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  He denies any associated bleeding or discharge.  Patient was diagnosed with H. pylori about a year ago.  She has been treated intermittently.  She has had some  early satiety and bloating related to this that is overall significantly better than it had been.  She endorses a good appetite.  She endorses normal bowel function.  She has both urinary urgency as well as urinary urge incontinence and some stress urinary incontinence.  GYN history is notable for a hysterectomy in 1999 for bleeding.  Tubes and ovaries were removed at that time per her report.  Family history is notable for ovarian cancer in her mother and colon cancer in her maternal grandmother.  Patient has a history of COPD.  She endorses smoking 1 pack of cigarettes per day, down from 3 packs/day previously.  She is not interested in stopping.  She uses an albuterol inhaler as needed, which she describes as once or twice every 6 months.  She has shortness of breath with any sort of activity, denies any shortness of breath or chest pain at rest.  PAST MEDICAL HISTORY:  Past Medical History:  Diagnosis Date   Anxiety    BMI 50.0-59.9, adult (HCC)    COPD (chronic obstructive pulmonary disease) (HCC)    Diverticulosis 03/14/2012   Dyspnea on exertion 08/14/2021   Fatty liver disease, nonalcoholic 03/14/2012   H. 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   OSA (obstructive sleep apnea) 04/06/2014   Peripheral edema 08/14/2021     PAST SURGICAL HISTORY:  Past Surgical History:  Procedure Laterality Date   ABDOMINAL HYSTERECTOMY  04/27/1997   BSO too   DILATION AND CURETTAGE OF UTERUS     for bleeding work-up before hysterectomy   HEMORRHOID SURGERY  04/27/1997   KNEE SURGERY  20 yrs ago   right    OB/GYN HISTORY:  OB History  Gravida Para Term Preterm AB Living  1 1     0 1  SAB IAB Ectopic Multiple Live Births               # Outcome Date GA Lbr Len/2nd Weight Sex Delivery Anes PTL Lv  1 Para             No LMP recorded.  Age at menarche: 17 Age at menopause: at the time of her  hysterectomy in 1999 Hx of HRT: denies Hx of STDs: denies Last pap: 2017 - normal per patient History of abnormal pap smears: denies  SCREENING STUDIES:  Last mammogram: 2023  Last colonoscopy: 2019  MEDICATIONS: No outpatient encounter medications on file as of 01/01/2022.   No facility-administered encounter medications on file as of 01/01/2022.    ALLERGIES:  Allergies  Allergen Reactions   Aspirin Anaphylaxis     FAMILY HISTORY:  Family History  Problem Relation Age of Onset   Ovarian cancer Mother    Diabetes Father    Colon cancer Maternal Grandmother      SOCIAL HISTORY:  Social Connections: Not on file    REVIEW OF SYSTEMS:  + leg swelling, join pain, back pain Denies appetite changes, fevers, chills, fatigue, unexplained weight changes. Denies hearing loss, neck lumps or masses, mouth sores, ringing in ears or voice changes. Denies cough or wheezing.  Denies shortness of breath. Denies chest pain or palpitations.  Denies abdominal distention, pain, blood in stools,  constipation, diarrhea, nausea, vomiting, or early satiety. Denies pain with intercourse, dysuria, frequency, hematuria or incontinence. Denies hot flashes, pelvic pain, vaginal bleeding or vaginal discharge.   Denies muscle pain/cramps. Denies itching, rash, or wounds. Denies dizziness, headaches, numbness or seizures. Denies swollen lymph nodes or glands, denies easy bruising or bleeding. Denies anxiety, depression, confusion, or decreased concentration.  Physical Exam:  Vital Signs for this encounter:  Blood pressure 137/65, pulse 70, temperature 98.4 F (36.9 C), temperature source Oral, resp. rate 18, height 5' 1.42" (1.56 m), weight 280 lb 9.6 oz (127.3 kg), SpO2 98 %. Body mass index is 52.3 kg/m. General: Alert, oriented, no acute distress.  HEENT: Normocephalic, atraumatic. Sclera anicteric.  Chest: Clear to auscultation bilaterally. No wheezes, rhonchi, or rales. Cardiovascular:  Regular rate and rhythm, no murmurs, rubs, or gallops.  Abdomen: Obese. Normoactive bowel sounds. Soft, nondistended, nontender to palpation. No masses or hepatosplenomegaly appreciated. No palpable fluid wave.  Extremities: Grossly normal range of motion. Warm, well perfused. Trace edema bilaterally.  Skin: No rashes or lesions.  Lymphatics: No cervical, supraclavicular, or inguinal adenopathy.  GU:  External genitalia notable for an approximately 3 x 1 cm lesion along the left inferior vulva, prior suture seen.  The edges of this lesion are raised, midportion looks mildly ulcerated.  On remainder of the vulvar exam, there is an approximately 3 x 2 cm area of leukoplakia along the left inner labia.  No other vulvar lesions noted.             Bladder/urethra:  No lesions or masses.             Vagina: Well rugated, no lesions or masses noted.             Cervix/uterus: Surgically absent.  Pap and HPV collected.             Adnexa: No masses appreciated.  LABORATORY AND RADIOLOGIC DATA:  Outside medical records were reviewed to synthesize the above history, along with the history and physical obtained during the visit.   Lab Results  Component Value Date   WBC 9.6 09/08/2010   HGB 14.3 09/08/2010   HCT 42.2 09/08/2010   PLT 188 09/08/2010   GLUCOSE 108 (H) 09/08/2010   CHOL  11/20/2008    173        ATP III CLASSIFICATION:  <200     mg/dL   Desirable  454-098  mg/dL   Borderline High  >=119    mg/dL   High          TRIG 83 11/20/2008   HDL 38 (L) 11/20/2008   LDLCALC (H) 11/20/2008    118        Total Cholesterol/HDL:CHD Risk Coronary Heart Disease Risk Table                     Men   Women  1/2 Average Risk   3.4   3.3  Average Risk       5.0   4.4  2 X Average Risk   9.6   7.1  3 X Average Risk  23.4   11.0        Use the calculated Patient Ratio above and the CHD Risk Table to determine the patient's CHD Risk.        ATP III CLASSIFICATION (LDL):  <100     mg/dL    Optimal  147-829  mg/dL   Near or Above  Optimal  130-159  mg/dL   Borderline  417-408  mg/dL   High  >144     mg/dL   Very High   ALT 18 81/85/6314   AST 16 09/08/2010   NA 137 09/08/2010   K 3.8 09/08/2010   CL 101 09/08/2010   CREATININE 0.60 09/08/2010   BUN 11 09/08/2010   CO2 24 09/08/2010   TSH 2.474 02/15/2009   INR 1.0 11/20/2008

## 2022-01-01 ENCOUNTER — Inpatient Hospital Stay: Payer: BC Managed Care – PPO | Attending: Gynecologic Oncology | Admitting: Gynecologic Oncology

## 2022-01-01 ENCOUNTER — Other Ambulatory Visit (HOSPITAL_COMMUNITY)
Admission: RE | Admit: 2022-01-01 | Discharge: 2022-01-01 | Disposition: A | Discharge status: Home / Self Care | Payer: BC Managed Care – PPO | Source: Ambulatory Visit | Attending: Gynecologic Oncology | Admitting: Gynecologic Oncology

## 2022-01-01 ENCOUNTER — Encounter: Payer: Self-pay | Admitting: Gynecologic Oncology

## 2022-01-01 ENCOUNTER — Inpatient Hospital Stay (HOSPITAL_BASED_OUTPATIENT_CLINIC_OR_DEPARTMENT_OTHER): Payer: BC Managed Care – PPO | Admitting: Gynecologic Oncology

## 2022-01-01 ENCOUNTER — Other Ambulatory Visit: Payer: Self-pay

## 2022-01-01 VITALS — BP 137/65 | HR 70 | Temp 98.4°F | Resp 18 | Ht 61.42 in | Wt 280.6 lb

## 2022-01-01 DIAGNOSIS — K760 Fatty (change of) liver, not elsewhere classified: Secondary | ICD-10-CM | POA: Insufficient documentation

## 2022-01-01 DIAGNOSIS — Z800 Family history of malignant neoplasm of digestive organs: Secondary | ICD-10-CM | POA: Insufficient documentation

## 2022-01-01 DIAGNOSIS — Z6841 Body Mass Index (BMI) 40.0 and over, adult: Secondary | ICD-10-CM | POA: Insufficient documentation

## 2022-01-01 DIAGNOSIS — Z720 Tobacco use: Secondary | ICD-10-CM

## 2022-01-01 DIAGNOSIS — F17210 Nicotine dependence, cigarettes, uncomplicated: Secondary | ICD-10-CM | POA: Insufficient documentation

## 2022-01-01 DIAGNOSIS — Z8041 Family history of malignant neoplasm of ovary: Secondary | ICD-10-CM | POA: Insufficient documentation

## 2022-01-01 DIAGNOSIS — Z90722 Acquired absence of ovaries, bilateral: Secondary | ICD-10-CM | POA: Insufficient documentation

## 2022-01-01 DIAGNOSIS — G4733 Obstructive sleep apnea (adult) (pediatric): Secondary | ICD-10-CM | POA: Insufficient documentation

## 2022-01-01 DIAGNOSIS — N3941 Urge incontinence: Secondary | ICD-10-CM | POA: Insufficient documentation

## 2022-01-01 DIAGNOSIS — N903 Dysplasia of vulva, unspecified: Secondary | ICD-10-CM | POA: Insufficient documentation

## 2022-01-01 DIAGNOSIS — F419 Anxiety disorder, unspecified: Secondary | ICD-10-CM | POA: Insufficient documentation

## 2022-01-01 DIAGNOSIS — D071 Carcinoma in situ of vulva: Secondary | ICD-10-CM | POA: Diagnosis present

## 2022-01-01 DIAGNOSIS — J449 Chronic obstructive pulmonary disease, unspecified: Secondary | ICD-10-CM | POA: Insufficient documentation

## 2022-01-01 DIAGNOSIS — R102 Pelvic and perineal pain: Secondary | ICD-10-CM | POA: Insufficient documentation

## 2022-01-01 DIAGNOSIS — Z780 Asymptomatic menopausal state: Secondary | ICD-10-CM | POA: Insufficient documentation

## 2022-01-01 DIAGNOSIS — B977 Papillomavirus as the cause of diseases classified elsewhere: Secondary | ICD-10-CM

## 2022-01-01 DIAGNOSIS — Z90710 Acquired absence of both cervix and uterus: Secondary | ICD-10-CM | POA: Insufficient documentation

## 2022-01-01 DIAGNOSIS — E785 Hyperlipidemia, unspecified: Secondary | ICD-10-CM | POA: Insufficient documentation

## 2022-01-01 NOTE — Patient Instructions (Signed)
Preparing for your Surgery   Plan for surgery on January 20, 2022 with Dr. Katherine Tucker at [facility]. You will be scheduled for wide local excision of the vulva, laser application to the vulva, possible vulvar biopsies.    Pre-operative Testing -You will receive a phone call from presurgical testing at  Hospital to discuss surgery instructions and arrange for lab work if needed.   -Bring your insurance card, copy of an advanced directive if applicable, medication list.   -You should not be taking blood thinners or aspirin at least ten days prior to surgery unless instructed by your surgeon.   -Do not take supplements such as fish oil (omega 3), red yeast rice, turmeric before your surgery. You want to avoid medications with aspirin in them including headache powders such as BC or Goody's), Excedrin migraine.   Day Before Surgery at Home -You will be advised you can have clear liquids up until 3 hours before your surgery.     Your role in recovery Your role is to become active as soon as directed by your doctor, while still giving yourself time to heal.  Rest when you feel tired. You will be asked to do the following in order to speed your recovery:   - Cough and breathe deeply. This helps to clear and expand your lungs and can prevent pneumonia after surgery.  - STAY ACTIVE WHEN YOU GET HOME. Do mild physical activity. Walking or moving your legs help your circulation and body functions return to normal. Do not try to get up or walk alone the first time after surgery.   -If you develop swelling on one leg or the other, pain in the back of your leg, redness/warmth in one of your legs, please call the office or go to the Emergency Room to have a doppler to rule out a blood clot. For shortness of breath, chest pain-seek care in the Emergency Room as soon as possible. - Actively manage your pain. Managing your pain lets you move in comfort. We will ask you to rate your  pain on a scale of zero to 10. It is your responsibility to tell your doctor or nurse where and how much you hurt so your pain can be treated.   Special Considerations -Your final pathology results from surgery should be available around one week after surgery and the results will be relayed to you when available.   -FMLA forms can be faxed to [phone] and please allow 5-7 business days for completion.   Pain Management After Surgery -You will be prescribed your pain medication and bowel regimen medications before surgery so that you can have these available when you are discharged from the hospital. The pain medication is for use ONLY AFTER surgery and a new prescription will not be given.    -Make sure that you have Tylenol and Ibuprofen at home IF YOU ARE ABLE TO TAKE THESE MEDICATIONS to use on a regular basis after surgery for pain control. We recommend alternating the medications every hour to six hours since they work differently and are processed in the body differently for pain relief.   -Review the attached handout on narcotic use and their risks and side effects.    Bowel Regimen -You will be prescribed Sennakot-S to take nightly to prevent constipation especially if you are taking the narcotic pain medication intermittently.  It is important to prevent constipation and drink adequate amounts of liquids. You can stop taking this medication when   you are not taking pain medication and you are back on your normal bowel routine.   Risks of Surgery Risks of surgery are low but include bleeding, infection, damage to surrounding structures, re-operation, blood clots, and very rarely death.   AFTER SURGERY INSTRUCTIONS   Return to work:  2-4 weeks if applicable   We recommend purchasing several bags of frozen green peas and dividing them into ziploc bags. You will want to keep these in the freezer and have them ready to use as ice packs to the vulvar incision. Once the ice pack is no  longer cold, you can get another from the freezer. The frozen peas mold to your body better than a regular ice pack.    Activity: 1. Be up and out of the bed during the day.  Take a nap if needed.  You may walk up steps but be careful and use the hand rail.  Stair climbing will tire you more than you think, you may need to stop part way and rest.    2. No lifting or straining for 4 weeks over 10 pounds. No pushing, pulling, straining for 4 weeks.   3. No driving for minimum 24 hours after surgery.  Do not drive if you are taking narcotic pain medicine and make sure that your reaction time has returned.    4. You can shower as soon as the next day after surgery. Shower daily. No tub baths or submerging your body in water until cleared by your surgeon. If you have the soap that was given to you by pre-surgical testing that was used before surgery, you do not need to use it afterwards because this can irritate your incisions.    5. No sexual activity and nothing in the vagina for 4 weeks.   6. You may experience vaginal/vulvar spotting and discharge after surgery.  The spotting is normal but if you experience heavy bleeding, call our office.   7. Take Tylenol or ibuprofen first for pain if you are able to take these medications and only use narcotic pain medication for severe pain not relieved by the Tylenol or Ibuprofen.  Monitor your Tylenol intake to a max of 4,000 mg in a 24 hour period. You can alternate these medications after surgery.   Diet: 1. Low sodium Heart Healthy Diet is recommended but you are cleared to resume your normal (before surgery) diet after your procedure.   2. It is safe to use a laxative, such as Miralax or Colace, if you have difficulty moving your bowels. You have been prescribed Sennakot at bedtime every evening to keep bowel movements regular and to prevent constipation.     Wound Care: 1. Keep clean and dry.  Shower daily.   Reasons to call the Doctor: Fever -  Oral temperature greater than 100.4 degrees Fahrenheit Foul-smelling vaginal discharge Difficulty urinating Nausea and vomiting Increased pain at the site of the incision that is unrelieved with pain medicine. Difficulty breathing with or without chest pain New calf pain especially if only on one side Sudden, continuing increased vaginal bleeding with or without clots.   Contacts: For questions or concerns you should contact:   Dr. Katherine Tucker at [phone]   Marilyn Nihiser, NP at [phone]   After Hours: call [phone] and have the GYN Oncologist paged/contacted (after 5 pm or on the weekends).   Messages sent via mychart are for non-urgent matters and are not responded to after hours so for urgent needs, please call the

## 2022-01-02 NOTE — Patient Instructions (Signed)
Preparing for your Surgery   Plan for surgery on January 20, 2022 with Dr. Eugene Garnet at [facility]. You will be scheduled for wide local excision of the vulva, laser application to the vulva, possible vulvar biopsies.    Pre-operative Testing -You will receive a phone call from presurgical testing at [facility] to discuss surgery instructions and arrange for lab work if needed.   -Bring your insurance card, copy of an advanced directive if applicable, medication list.   -You should not be taking blood thinners or aspirin at least ten days prior to surgery unless instructed by your surgeon.   -Do not take supplements such as fish oil (omega 3), red yeast rice, turmeric before your surgery. You want to avoid medications with aspirin in them including headache powders such as BC or Goody's), Excedrin migraine.   Day Before Surgery at Home -You will be advised you can have clear liquids up until 3 hours before your surgery.     Your role in recovery Your role is to become active as soon as directed by your doctor, while still giving yourself time to heal.  Rest when you feel tired. You will be asked to do the following in order to speed your recovery:   - Cough and breathe deeply. This helps to clear and expand your lungs and can prevent pneumonia after surgery.  - STAY ACTIVE WHEN YOU GET HOME. Do mild physical activity. Walking or moving your legs help your circulation and body functions return to normal. Do not try to get up or walk alone the first time after surgery.   -If you develop swelling on one leg or the other, pain in the back of your leg, redness/warmth in one of your legs, please call the office or go to the Emergency Room to have a doppler to rule out a blood clot. For shortness of breath, chest pain-seek care in the Emergency Room as soon as possible. - Actively manage your pain. Managing your pain lets you move in comfort. We will ask you to rate your  pain on a scale of zero to 10. It is your responsibility to tell your doctor or nurse where and how much you hurt so your pain can be treated.   Special Considerations -Your final pathology results from surgery should be available around one week after surgery and the results will be relayed to you when available.   -FMLA forms can be faxed to [phone] and please allow 5-7 business days for completion.   Pain Management After Surgery -You will be prescribed your pain medication and bowel regimen medications before surgery so that you can have these available when you are discharged from the hospital. The pain medication is for use ONLY AFTER surgery and a new prescription will not be given.    -Make sure that you have Tylenol and Ibuprofen at home IF YOU ARE ABLE TO TAKE THESE MEDICATIONS to use on a regular basis after surgery for pain control. We recommend alternating the medications every hour to six hours since they work differently and are processed in the body differently for pain relief.   -Review the attached handout on narcotic use and their risks and side effects.    Bowel Regimen -You will be prescribed Sennakot-S to take nightly to prevent constipation especially if you are taking the narcotic pain medication intermittently.  It is important to prevent constipation and drink adequate amounts of liquids. You can stop taking this medication when  you are not taking pain medication and you are back on your normal bowel routine.   Risks of Surgery Risks of surgery are low but include bleeding, infection, damage to surrounding structures, re-operation, blood clots, and very rarely death.   AFTER SURGERY INSTRUCTIONS   Return to work:  2-4 weeks if applicable   We recommend purchasing several bags of frozen green peas and dividing them into ziploc bags. You will want to keep these in the freezer and have them ready to use as ice packs to the vulvar incision. Once the ice pack is no  longer cold, you can get another from the freezer. The frozen peas mold to your body better than a regular ice pack.    Activity: 1. Be up and out of the bed during the day.  Take a nap if needed.  You may walk up steps but be careful and use the hand rail.  Stair climbing will tire you more than you think, you may need to stop part way and rest.    2. No lifting or straining for 4 weeks over 10 pounds. No pushing, pulling, straining for 4 weeks.   3. No driving for minimum 24 hours after surgery.  Do not drive if you are taking narcotic pain medicine and make sure that your reaction time has returned.    4. You can shower as soon as the next day after surgery. Shower daily. No tub baths or submerging your body in water until cleared by your surgeon. If you have the soap that was given to you by pre-surgical testing that was used before surgery, you do not need to use it afterwards because this can irritate your incisions.    5. No sexual activity and nothing in the vagina for 4 weeks.   6. You may experience vaginal/vulvar spotting and discharge after surgery.  The spotting is normal but if you experience heavy bleeding, call our office.   7. Take Tylenol or ibuprofen first for pain if you are able to take these medications and only use narcotic pain medication for severe pain not relieved by the Tylenol or Ibuprofen.  Monitor your Tylenol intake to a max of 4,000 mg in a 24 hour period. You can alternate these medications after surgery.   Diet: 1. Low sodium Heart Healthy Diet is recommended but you are cleared to resume your normal (before surgery) diet after your procedure.   2. It is safe to use a laxative, such as Miralax or Colace, if you have difficulty moving your bowels. You have been prescribed Sennakot at bedtime every evening to keep bowel movements regular and to prevent constipation.     Wound Care: 1. Keep clean and dry.  Shower daily.   Reasons to call the Doctor: Fever -  Oral temperature greater than 100.4 degrees Fahrenheit Foul-smelling vaginal discharge Difficulty urinating Nausea and vomiting Increased pain at the site of the incision that is unrelieved with pain medicine. Difficulty breathing with or without chest pain New calf pain especially if only on one side Sudden, continuing increased vaginal bleeding with or without clots.   Contacts: For questions or concerns you should contact:   Dr. Eugene Garnet at [phone]   Warner Mccreedy, NP at [phone]   After Hours: call [phone] and have the GYN Oncologist paged/contacted (after 5 pm or on the weekends).   Messages sent via mychart are for non-urgent matters and are not responded to after hours so for urgent needs, please call the  after hours number.

## 2022-01-02 NOTE — Progress Notes (Signed)
Patient here with family for new patient consultation with Dr. Eugene Garnet and for a pre-operative appointment prior to her scheduled surgery on January 20, 2022. She is scheduled for wide local excision of the vulva, laser application to the vulva, possible vulvar biopsies. The surgery was discussed in detail.  See after visit summary for additional details. Patient given IS, peri bottle, and supportive pillow for the vulva to use post-op.   Discussed post-op pain management in detail including the aspects of the enhanced recovery pathway.  Advised her that a new prescription would be sent in for oxycodone and it is only to be used for after her upcoming surgery.  We discussed the use of tylenol post-op and to monitor for a maximum of 4,000 mg in a 24 hour period.  Also will prescribe sennakot to be used after surgery and to hold if having loose stools.  Discussed bowel regimen in detail.     Discussed measures to take at home to prevent DVT including frequent mobility.  Reportable signs and symptoms of DVT discussed. Post-operative instructions discussed and expectations for after surgery. Incisional care discussed as well including reportable signs and symptoms including erythema, drainage, wound separation.     10 minutes spent with the patient.  Verbalizing understanding of material discussed. No needs or concerns voiced at the end of the visit.   Advised patient and family to call for any needs.  Advised that her post-operative medications would be prescribed closer to the surgery date.   This appointment is included in the global surgical bundle as pre-operative teaching and has no charge.

## 2022-01-05 NOTE — Addendum Note (Signed)
Addended by: [place], Gera Inboden B on: 01/05/2022 01:11 PM   Modules accepted: Orders

## 2022-01-08 LAB — CYTOLOGY - PAP
Comment: NEGATIVE
Diagnosis: NEGATIVE
High risk HPV: NEGATIVE

## 2022-01-09 ENCOUNTER — Telehealth: Payer: Self-pay | Admitting: *Deleted

## 2022-01-09 NOTE — Telephone Encounter (Signed)
Received a message from pre admission testing; they are unable to reach the patient to schedule an appt.  Called and left both the patient and her son a message to call the office back regarding her surgery

## 2022-01-13 ENCOUNTER — Other Ambulatory Visit: Payer: Self-pay | Admitting: Gynecologic Oncology

## 2022-01-13 NOTE — Progress Notes (Signed)
Called the patient to discuss pap test, no answer. Call back requested.  Jeral Pinch MD Gynecologic Oncology

## 2022-01-14 ENCOUNTER — Telehealth: Payer: Self-pay

## 2022-01-14 NOTE — Progress Notes (Signed)
COVID Vaccine Completed:  Date of COVID positive in last 90 days:  PCP - Arlyss Repress, MD Cardiologist -   Chest x-ray -  EKG -  Stress Test -  ECHO -  Cardiac Cath - 2015 Pacemaker/ICD device last checked: Spinal Cord Stimulator:  Bowel Prep -   Sleep Study -  CPAP -   Fasting Blood Sugar -  Checks Blood Sugar _____ times a day  Blood Thinner Instructions: Aspirin Instructions: Last Dose:  Activity level:  Can go up a flight of stairs and perform activities of daily living without stopping and without symptoms of chest pain or shortness of breath.  Able to exercise without symptoms  Unable to go up a flight of stairs without symptoms of     Anesthesia review: Atherosclerosis of aorta, OSA, CP, COPD, heart murmur, palpitations  Patient denies shortness of breath, fever, cough and chest pain at PAT appointment  Patient verbalized understanding of instructions that were given to them at the PAT appointment. Patient was also instructed that they will need to review over the PAT instructions again at home before surgery.

## 2022-01-14 NOTE — Patient Instructions (Signed)
SURGICAL WAITING ROOM VISITATION Patients having surgery or a procedure may have no more than 2 support people in the waiting area - these visitors may rotate.   Children under the age of 14 must have an adult with them who is not the patient. If the patient needs to stay at the hospital during part of their recovery, the visitor guidelines for inpatient rooms apply. Pre-op nurse will coordinate an appropriate time for 1 support person to accompany patient in pre-op.  This support person may not rotate.    Please refer to the [facility] website for the visitor guidelines for Inpatients (after your surgery is over and you are in a regular room).     Your procedure is scheduled on: 01/20/22    Report to [facility] Main Entrance    Report to admitting at 1:15 PM   Call this number if you have problems the morning of surgery [phone]   Do not eat food :After Midnight.   After Midnight you may have the following liquids until 12:30 PM DAY OF SURGERY  Water Non-Citrus Juices (without pulp, NO RED) Carbonated Beverages Black Coffee (NO MILK/CREAM OR CREAMERS, sugar ok)  Clear Tea (NO MILK/CREAM OR CREAMERS, sugar ok) regular and decaf                             Plain Jell-O (NO RED)                                           Fruit ices (not with fruit pulp, NO RED)                                     Popsicles (NO RED)                                                               Sports drinks like Gatorade (NO RED)                  If you have questions, please contact your surgeon's office.   FOLLOW BOWEL PREP AND ANY ADDITIONAL PRE OP INSTRUCTIONS YOU RECEIVED FROM YOUR SURGEON'S OFFICE!!!     Oral Hygiene is also important to reduce your risk of infection.                                    Remember - BRUSH YOUR TEETH THE MORNING OF SURGERY WITH YOUR REGULAR TOOTHPASTE   Do NOT smoke after Midnight   Take these medicines the morning of surgery with A SIP OF WATER:  Inhaler                              You may not have any metal on your body including hair pins, jewelry, and body piercing             Do not wear make-up, lotions, powders, perfumes, or deodorant  Do  not wear nail polish including gel and S&S, artificial/acrylic nails, or any other type of covering on natural nails including finger and toenails. If you have artificial nails, gel coating, etc. that needs to be removed by a nail salon please have this removed prior to surgery or surgery may need to be canceled/ delayed if the surgeon/ anesthesia feels like they are unable to be safely monitored.   Do not shave  48 hours prior to surgery.    Do not bring valuables to the hospital. [place].   Contacts, dentures or bridgework may not be worn into surgery.  DO NOT [place]. PHARMACY WILL DISPENSE MEDICATIONS LISTED ON YOUR MEDICATION LIST TO YOU DURING YOUR ADMISSION [place]!    Patients discharged on the day of surgery will not be allowed to drive home.  Someone NEEDS to stay with you for the first 24 hours after anesthesia.   Special Instructions: Bring a copy of your healthcare power of attorney and living will documents the day of surgery if you haven't scanned them before.              Please read over the following fact sheets you were given: IF [place] [phone] Schneiders   If you received a COVID test during your pre-op visit  it is requested that you wear a mask when out in public, stay away from anyone that may not be feeling well and notify your surgeon if you develop symptoms. If you test positive for Covid or have been in contact with anyone that has tested positive in the last 10 days please notify you surgeon.     [facility] - Preparing for Surgery Before surgery, you can play an important role.  Because skin is not sterile, your  skin needs to be as free of germs as possible.  You can reduce the number of germs on your skin by washing with CHG (chlorahexidine gluconate) soap before surgery.  CHG is an antiseptic cleaner which kills germs and bonds with the skin to continue killing germs even after washing. Please DO NOT use if you have an allergy to CHG or antibacterial soaps.  If your skin becomes reddened/irritated stop using the CHG and inform your nurse when you arrive at Short Stay. Do not shave (including legs and underarms) for at least 48 hours prior to the first CHG shower.  You may shave your face/neck.  Please follow these instructions carefully:  1.  Shower with CHG Soap the night before surgery and the  morning of surgery.  2.  If you choose to wash your hair, wash your hair first as usual with your normal  shampoo.  3.  After you shampoo, rinse your hair and body thoroughly to remove the shampoo.                             4.  Use CHG as you would any other liquid soap.  You can apply chg directly to the skin and wash.  Gently with a scrungie or clean washcloth.  5.  Apply the CHG Soap to your body ONLY FROM THE NECK DOWN.   Do   not use on face/ open  Wound or open sores. Avoid contact with eyes, ears mouth and   genitals (private parts).                       Wash face,  Genitals (private parts) with your normal soap.             6.  Wash thoroughly, paying special attention to the area where your    surgery  will be performed.  7.  Thoroughly rinse your body with warm water from the neck down.  8.  DO NOT shower/wash with your normal soap after using and rinsing off the CHG Soap.                9.  Pat yourself dry with a clean towel.            10.  Wear clean pajamas.            11.  Place clean sheets on your bed the night of your first shower and do not  sleep with pets. Day of Surgery : Do not apply any lotions/deodorants the morning of surgery.  Please wear clean clothes to  the hospital/surgery center.  FAILURE TO FOLLOW THESE INSTRUCTIONS MAY RESULT IN THE CANCELLATION OF YOUR SURGERY  PATIENT SIGNATURE_________________________________  NURSE SIGNATURE__________________________________  ________________________________________________________________________

## 2022-01-14 NOTE — Telephone Encounter (Signed)
LM for [name] stating that her Pap Smear was negative for pre-cancer or cancer.  The HPV high risk was also negative. If she has any questions regarding the results noted, she can call back to the office at [phone].

## 2022-01-15 ENCOUNTER — Encounter (HOSPITAL_COMMUNITY): Payer: Self-pay

## 2022-01-15 ENCOUNTER — Encounter (HOSPITAL_COMMUNITY)
Admission: RE | Admit: 2022-01-15 | Discharge: 2022-01-15 | Disposition: A | Discharge status: Home / Self Care | Payer: BC Managed Care – PPO | Source: Ambulatory Visit | Attending: Gynecologic Oncology | Admitting: Gynecologic Oncology

## 2022-01-15 VITALS — BP 159/81 | HR 93 | Temp 98.4°F | Resp 16 | Ht 62.0 in | Wt 285.0 lb

## 2022-01-15 DIAGNOSIS — Z01812 Encounter for preprocedural laboratory examination: Secondary | ICD-10-CM | POA: Insufficient documentation

## 2022-01-15 DIAGNOSIS — D071 Carcinoma in situ of vulva: Secondary | ICD-10-CM | POA: Diagnosis not present

## 2022-01-15 DIAGNOSIS — I7090 Unspecified atherosclerosis: Secondary | ICD-10-CM

## 2022-01-15 DIAGNOSIS — F17200 Nicotine dependence, unspecified, uncomplicated: Secondary | ICD-10-CM | POA: Insufficient documentation

## 2022-01-15 DIAGNOSIS — R011 Cardiac murmur, unspecified: Secondary | ICD-10-CM | POA: Insufficient documentation

## 2022-01-15 DIAGNOSIS — R7989 Other specified abnormal findings of blood chemistry: Secondary | ICD-10-CM | POA: Insufficient documentation

## 2022-01-15 DIAGNOSIS — G4733 Obstructive sleep apnea (adult) (pediatric): Secondary | ICD-10-CM | POA: Insufficient documentation

## 2022-01-15 DIAGNOSIS — J449 Chronic obstructive pulmonary disease, unspecified: Secondary | ICD-10-CM | POA: Diagnosis not present

## 2022-01-15 LAB — COMPREHENSIVE METABOLIC PANEL
ALT: 31 U/L (ref 0–44)
AST: 27 U/L (ref 15–41)
Albumin: 3.8 g/dL (ref 3.5–5.0)
Alkaline Phosphatase: 69 U/L (ref 38–126)
Anion gap: 7 (ref 5–15)
BUN: 13 mg/dL (ref 6–20)
CO2: 26 mmol/L (ref 22–32)
Calcium: 9.3 mg/dL (ref 8.9–10.3)
Chloride: 106 mmol/L (ref 98–111)
Creatinine, Ser: 0.67 mg/dL (ref 0.44–1.00)
GFR, Estimated: >60 mL/min (ref >60)
Glucose, Bld: 131 mg/dL — ABNORMAL HIGH (ref 70–99)
Potassium: 4 mmol/L (ref 3.5–5.1)
Sodium: 139 mmol/L (ref 135–145)
Total Bilirubin: 0.3 mg/dL (ref 0.3–1.2)
Total Protein: 7 g/dL (ref 6.5–8.1)

## 2022-01-15 LAB — CBC
HCT: 45 % (ref 36.0–46.0)
Hemoglobin: 14.6 g/dL (ref 12.0–15.0)
MCH: 31.9 pg (ref 26.0–34.0)
MCHC: 32.4 g/dL (ref 30.0–36.0)
MCV: 98.5 fL (ref 80.0–100.0)
Platelets: 232 10*3/uL (ref 150–400)
RBC: 4.57 MIL/uL (ref 3.87–5.11)
RDW: 14.5 % (ref 11.5–15.5)
WBC: 7.8 10*3/uL (ref 4.0–10.5)
nRBC: 0 % (ref 0.0–0.2)

## 2022-01-16 ENCOUNTER — Telehealth: Payer: Self-pay

## 2022-01-16 ENCOUNTER — Other Ambulatory Visit: Payer: Self-pay | Admitting: Gynecologic Oncology

## 2022-01-16 DIAGNOSIS — D071 Carcinoma in situ of vulva: Secondary | ICD-10-CM

## 2022-01-16 MED ORDER — LIDOCAINE HCL URETHRAL/MUCOSAL 2 % EX PRSY: 1.0000 | PREFILLED_SYRINGE | CUTANEOUS | 3 refills | Status: AC | PRN

## 2022-01-16 MED ORDER — OXYCODONE HCL 5 MG PO TABS: 5.0000 mg | ORAL_TABLET | ORAL | 0 refills | Status: AC | PRN

## 2022-01-16 MED ORDER — SENNOSIDES-DOCUSATE SODIUM 8.6-50 MG PO TABS: 2.0000 | ORAL_TABLET | Freq: Every day | ORAL | 0 refills | Status: AC

## 2022-01-16 MED ORDER — LIDOCAINE 5 % EX OINT: 1.0000 | TOPICAL_OINTMENT | Freq: Three times a day (TID) | CUTANEOUS | 3 refills | Status: DC | PRN

## 2022-01-16 NOTE — Progress Notes (Signed)
Anesthesia Chart Review   Case: [id number] Date/Time: 01/20/22 1515   Procedures:      WIDE LOCAL EXCISION OF VULVA     CO2 LASER APPLICATION TO VULVA     VULVAR BIOPSY POSSIBLE   Anesthesia type: General   Pre-op diagnosis: Vulvar intraepithelial neoplasia three   Location: [facility] ROOM 03 / [facility]   Surgeons: Lafonda Mosses, MD       DISCUSSION:56 y.o. smoker with h/o OSA, COPD, vulvar intraepithelial neoplasia scheduled for above procedure 01/20/2022 with Dr. Jeral Pinch.   Pt evaluated by cardiology 08/14/2021.   Low risk stress test 09/19/2021.   Echo 08/21/2021 with EF 65-70%, no valvular problems.   Anticipate pt can proceed with planned procedure barring acute status change.   VS: BP (!) 159/81   Pulse 93   Temp 36.9 C (Oral)   Resp 16   Ht 5\' 2"  (1.575 m)   Wt 129.3 kg   SpO2 97%   BMI 52.13 kg/m   PROVIDERS: Arlyss Repress, MD is PCP   Orlinda Blalock, MD is Cardiologist  LABS: Labs reviewed: Acceptable for surgery. (all labs ordered are listed, but only abnormal results are displayed)  Labs Reviewed  COMPREHENSIVE METABOLIC PANEL - Abnormal; Notable for the following components:      Result Value   Glucose, Bld 131 (*)    All other components within normal limits  CBC     IMAGES:   EKG:   CV: Echo 08/21/2021 SUMMARY  The LV endocardium is not well visualized. LV function is grossly  normal.  LV ejection fraction = 65-70%.  No significant valvular stenosis or insufficiency is seen  There is no pericardial effusion.  There is no comparison study available.  Images are technically limited.  Past Medical History:  Diagnosis Date   Anxiety    BMI 50.0-59.9, adult ([place])    COPD (chronic obstructive pulmonary disease) ([place])    Diverticulosis 03/14/2012   Dyspnea on exertion 08/14/2021   Fatty liver disease, nonalcoholic 85/27/7824   H. pylori infection    Hemorrhoids    Hiatal hernia    Hyperlipidemia 08/01/2012   Mixed stress and urge  urinary incontinence 06/01/2019   OSA (obstructive sleep apnea) 04/06/2014   Peripheral edema 08/14/2021    Past Surgical History:  Procedure Laterality Date   ABDOMINAL HYSTERECTOMY  04/27/1997   BSO too   [place]     for bleeding work-up before hysterectomy   HEMORRHOID SURGERY  04/27/1997   KNEE SURGERY  20 yrs ago   right   TONSILLECTOMY      MEDICATIONS:  albuterol (VENTOLIN HFA) 108 (90 Base) MCG/ACT inhaler   amoxicillin (AMOXIL) 250 MG capsule   Diclofenac Sodium 3 % GEL   mupirocin ointment (BACTROBAN) 2 %   NYSTATIN powder   promethazine (PHENERGAN) 25 MG tablet   silver sulfADIAZINE (SILVADENE) 1 % cream   traMADol (ULTRAM) 50 MG tablet   triamcinolone cream (KENALOG) 0.1 %   No current facility-administered medications for this encounter.    Konrad Felix Ward, PA-C [facility] [phone]

## 2022-01-16 NOTE — Telephone Encounter (Signed)
Per Joylene John NP, Lidoacine cream 4% 30g tube called to pharmacy for patient to use post operatively for vulva discomfort

## 2022-01-16 NOTE — Progress Notes (Signed)
Post-op meds sent in preop. 

## 2022-01-19 ENCOUNTER — Telehealth: Payer: Self-pay

## 2022-01-19 NOTE — Telephone Encounter (Signed)
Telephone call to check on pre-operative status.  Patient compliant with pre-operative instructions.  Reinforced nothing to eat after midnight. Clear liquids until 8:00am. Patient to arrive at 9:00am.  No questions or concerns voiced.  Instructed to call for any needs.

## 2022-01-20 ENCOUNTER — Encounter (HOSPITAL_COMMUNITY): Payer: Self-pay | Admitting: Gynecologic Oncology

## 2022-01-20 ENCOUNTER — Encounter (HOSPITAL_COMMUNITY)
Admission: RE | Disposition: A | Discharge status: Home / Self Care | Payer: Self-pay | Source: Ambulatory Visit | Attending: Gynecologic Oncology

## 2022-01-20 ENCOUNTER — Ambulatory Visit (HOSPITAL_COMMUNITY)
Admission: RE | Admit: 2022-01-20 | Discharge: 2022-01-20 | Disposition: A | Discharge status: Home / Self Care | Payer: BC Managed Care – PPO | Source: Ambulatory Visit | Attending: Gynecologic Oncology | Admitting: Gynecologic Oncology

## 2022-01-20 ENCOUNTER — Ambulatory Visit (HOSPITAL_COMMUNITY): Payer: BC Managed Care – PPO | Admitting: Certified Registered"

## 2022-01-20 ENCOUNTER — Other Ambulatory Visit: Payer: Self-pay

## 2022-01-20 ENCOUNTER — Ambulatory Visit (HOSPITAL_COMMUNITY): Payer: BC Managed Care – PPO | Admitting: Physician Assistant

## 2022-01-20 DIAGNOSIS — Z8041 Family history of malignant neoplasm of ovary: Secondary | ICD-10-CM | POA: Diagnosis not present

## 2022-01-20 DIAGNOSIS — G4730 Sleep apnea, unspecified: Secondary | ICD-10-CM | POA: Insufficient documentation

## 2022-01-20 DIAGNOSIS — F17210 Nicotine dependence, cigarettes, uncomplicated: Secondary | ICD-10-CM | POA: Diagnosis not present

## 2022-01-20 DIAGNOSIS — Z800 Family history of malignant neoplasm of digestive organs: Secondary | ICD-10-CM | POA: Diagnosis not present

## 2022-01-20 DIAGNOSIS — J449 Chronic obstructive pulmonary disease, unspecified: Secondary | ICD-10-CM | POA: Diagnosis not present

## 2022-01-20 DIAGNOSIS — R011 Cardiac murmur, unspecified: Secondary | ICD-10-CM

## 2022-01-20 DIAGNOSIS — Z90710 Acquired absence of both cervix and uterus: Secondary | ICD-10-CM | POA: Diagnosis not present

## 2022-01-20 DIAGNOSIS — Z6841 Body Mass Index (BMI) 40.0 and over, adult: Secondary | ICD-10-CM | POA: Insufficient documentation

## 2022-01-20 DIAGNOSIS — Z780 Asymptomatic menopausal state: Secondary | ICD-10-CM | POA: Diagnosis not present

## 2022-01-20 DIAGNOSIS — D071 Carcinoma in situ of vulva: Secondary | ICD-10-CM | POA: Insufficient documentation

## 2022-01-20 HISTORY — PX: VULVA /PERINEUM BIOPSY: SHX319

## 2022-01-20 HISTORY — PX: VULVECTOMY: SHX1086

## 2022-01-20 HISTORY — PX: CO2 LASER APPLICATION: SHX5778

## 2022-01-20 LAB — GLUCOSE, CAPILLARY: Glucose-Capillary: 75 mg/dL (ref 70–99)

## 2022-01-20 SURGERY — VULVECTOMY
Anesthesia: General

## 2022-01-20 MED ORDER — DEXAMETHASONE SODIUM PHOSPHATE 10 MG/ML IJ SOLN
INTRAMUSCULAR | Status: AC
  Filled 2022-01-20: qty 1

## 2022-01-20 MED ORDER — SUGAMMADEX SODIUM 500 MG/5ML IV SOLN
INTRAVENOUS | Status: DC | PRN
  Administered 2022-01-20: 300 mg via INTRAVENOUS

## 2022-01-20 MED ORDER — MIDAZOLAM HCL 5 MG/5ML IJ SOLN
INTRAMUSCULAR | Status: DC | PRN
  Administered 2022-01-20: 2 mg via INTRAVENOUS

## 2022-01-20 MED ORDER — 0.9 % SODIUM CHLORIDE (POUR BTL) OPTIME
TOPICAL | Status: DC | PRN
  Administered 2022-01-20: 500 mL

## 2022-01-20 MED ORDER — ROCURONIUM BROMIDE 10 MG/ML (PF) SYRINGE
PREFILLED_SYRINGE | INTRAVENOUS | Status: DC | PRN
  Administered 2022-01-20: 50 mg via INTRAVENOUS

## 2022-01-20 MED ORDER — ORAL CARE MOUTH RINSE: 15.0000 mL | Freq: Once | OROMUCOSAL | Status: AC

## 2022-01-20 MED ORDER — ACETAMINOPHEN 500 MG PO TABS
1000.0000 mg | ORAL_TABLET | ORAL | Status: AC
  Administered 2022-01-20: 1000 mg via ORAL
  Filled 2022-01-20: qty 2

## 2022-01-20 MED ORDER — ONDANSETRON HCL 4 MG/2ML IJ SOLN: 4.0000 mg | Freq: Once | INTRAMUSCULAR | Status: DC | PRN

## 2022-01-20 MED ORDER — LIDOCAINE 2% (20 MG/ML) 5 ML SYRINGE
INTRAMUSCULAR | Status: DC | PRN
  Administered 2022-01-20: 100 mg via INTRAVENOUS

## 2022-01-20 MED ORDER — OXYCODONE HCL 5 MG PO TABS: 5.0000 mg | ORAL_TABLET | Freq: Once | ORAL | Status: DC | PRN

## 2022-01-20 MED ORDER — ONDANSETRON HCL 4 MG/2ML IJ SOLN
INTRAMUSCULAR | Status: AC
  Filled 2022-01-20: qty 2

## 2022-01-20 MED ORDER — OXYCODONE HCL 5 MG/5ML PO SOLN: 5.0000 mg | Freq: Once | ORAL | Status: DC | PRN

## 2022-01-20 MED ORDER — DEXAMETHASONE SODIUM PHOSPHATE 4 MG/ML IJ SOLN: 4.0000 mg | INTRAMUSCULAR | Status: DC

## 2022-01-20 MED ORDER — IODINE STRONG (LUGOLS) 5 % PO SOLN
ORAL | Status: AC
  Filled 2022-01-20: qty 1

## 2022-01-20 MED ORDER — PROPOFOL 10 MG/ML IV BOLUS
INTRAVENOUS | Status: DC | PRN
  Administered 2022-01-20: 200 mg via INTRAVENOUS

## 2022-01-20 MED ORDER — SUCCINYLCHOLINE CHLORIDE 200 MG/10ML IV SOSY
PREFILLED_SYRINGE | INTRAVENOUS | Status: DC | PRN
  Administered 2022-01-20: 140 mg via INTRAVENOUS

## 2022-01-20 MED ORDER — LACTATED RINGERS IV SOLN: INTRAVENOUS | Status: DC

## 2022-01-20 MED ORDER — ONDANSETRON HCL 4 MG/2ML IJ SOLN
INTRAMUSCULAR | Status: DC | PRN
  Administered 2022-01-20: 4 mg via INTRAVENOUS

## 2022-01-20 MED ORDER — FERRIC SUBSULFATE 259 MG/GM EX SOLN
CUTANEOUS | Status: AC
  Filled 2022-01-20: qty 8

## 2022-01-20 MED ORDER — LIDOCAINE HCL 1 % IJ SOLN
INTRAMUSCULAR | Status: DC | PRN
  Administered 2022-01-20: 20 mL

## 2022-01-20 MED ORDER — PROPOFOL 10 MG/ML IV BOLUS
INTRAVENOUS | Status: AC
  Filled 2022-01-20: qty 20

## 2022-01-20 MED ORDER — SCOPOLAMINE 1 MG/3DAYS TD PT72
1.0000 | MEDICATED_PATCH | TRANSDERMAL | Status: DC
  Administered 2022-01-20: 1.5 mg via TRANSDERMAL
  Filled 2022-01-20: qty 1

## 2022-01-20 MED ORDER — ALBUTEROL SULFATE HFA 108 (90 BASE) MCG/ACT IN AERS
INHALATION_SPRAY | RESPIRATORY_TRACT | Status: DC | PRN
  Administered 2022-01-20 (×2): 2 via RESPIRATORY_TRACT

## 2022-01-20 MED ORDER — ACETIC ACID 5 % SOLN
Status: AC
  Filled 2022-01-20: qty 50

## 2022-01-20 MED ORDER — FENTANYL CITRATE (PF) 100 MCG/2ML IJ SOLN
INTRAMUSCULAR | Status: DC | PRN
  Administered 2022-01-20 (×2): 50 ug via INTRAVENOUS

## 2022-01-20 MED ORDER — LIDOCAINE HCL (PF) 2 % IJ SOLN
INTRAMUSCULAR | Status: AC
  Filled 2022-01-20: qty 5

## 2022-01-20 MED ORDER — DEXAMETHASONE SODIUM PHOSPHATE 10 MG/ML IJ SOLN
INTRAMUSCULAR | Status: DC | PRN
  Administered 2022-01-20: 10 mg via INTRAVENOUS

## 2022-01-20 MED ORDER — MIDAZOLAM HCL 2 MG/2ML IJ SOLN
INTRAMUSCULAR | Status: AC
  Filled 2022-01-20: qty 2

## 2022-01-20 MED ORDER — HYDROMORPHONE HCL 1 MG/ML IJ SOLN: 0.2500 mg | INTRAMUSCULAR | Status: DC | PRN

## 2022-01-20 MED ORDER — SILVER SULFADIAZINE 1 % EX CREA
1.0000 | TOPICAL_CREAM | Freq: Once | CUTANEOUS | Status: AC
  Administered 2022-01-20: 1 via TOPICAL
  Filled 2022-01-20: qty 50

## 2022-01-20 MED ORDER — ALBUTEROL SULFATE HFA 108 (90 BASE) MCG/ACT IN AERS
INHALATION_SPRAY | RESPIRATORY_TRACT | Status: AC
  Filled 2022-01-20: qty 6.7

## 2022-01-20 MED ORDER — LIDOCAINE HCL (PF) 1 % IJ SOLN
INTRAMUSCULAR | Status: AC
  Filled 2022-01-20: qty 30

## 2022-01-20 MED ORDER — CHLORHEXIDINE GLUCONATE 0.12 % MT SOLN
15.0000 mL | Freq: Once | OROMUCOSAL | Status: AC
  Administered 2022-01-20: 15 mL via OROMUCOSAL

## 2022-01-20 MED ORDER — FENTANYL CITRATE (PF) 100 MCG/2ML IJ SOLN
INTRAMUSCULAR | Status: AC
  Filled 2022-01-20: qty 2

## 2022-01-20 MED ORDER — SILVER SULFADIAZINE 1 % EX CREA: TOPICAL_CREAM | CUTANEOUS | Status: DC | PRN

## 2022-01-20 SURGICAL SUPPLY — 52 items
BAG COUNTER SPONGE SURGICOUNT (BAG) IMPLANT
BAG SPNG CNTER NS LX DISP (BAG)
BLADE SURG 15 STRL LF DISP TIS (BLADE) ×4 IMPLANT
BLADE SURG 15 STRL SS (BLADE) ×2
BNDG GAUZE DERMACEA FLUFF 4 (GAUZE/BANDAGES/DRESSINGS) IMPLANT
BNDG GZE DERMACEA 4 6PLY (GAUZE/BANDAGES/DRESSINGS)
BRIEF MESH DISP LRG (UNDERPADS AND DIAPERS) ×2 IMPLANT
COVER SURGICAL LIGHT HANDLE (MISCELLANEOUS) ×2 IMPLANT
DEPRESSOR TONGUE 6 IN STERILE (GAUZE/BANDAGES/DRESSINGS) ×2 IMPLANT
DRAPE SHEET LG 3/4 BI-LAMINATE (DRAPES) ×2 IMPLANT
DRAPE UNDERBUTTOCKS STRL (DISPOSABLE) IMPLANT
DRSG TELFA 3X8 NADH STRL (GAUZE/BANDAGES/DRESSINGS) IMPLANT
ELECT BALL LEEP 3MM BLK (ELECTRODE) IMPLANT
GAUZE PAD ABD 8X10 STRL (GAUZE/BANDAGES/DRESSINGS) IMPLANT
GLOVE BIO SURGEON STRL SZ 6 (GLOVE) ×4 IMPLANT
GLOVE BIO SURGEON STRL SZ 6.5 (GLOVE) ×2 IMPLANT
GOWN STRL REUS W/ TWL LRG LVL3 (GOWN DISPOSABLE) ×4 IMPLANT
GOWN STRL REUS W/TWL LRG LVL3 (GOWN DISPOSABLE) ×2
KIT BASIN OR (CUSTOM PROCEDURE TRAY) ×2 IMPLANT
NDL HYPO 25X1 1.5 SAFETY (NEEDLE) ×2 IMPLANT
NDL SPNL 22GX7 QUINCKE BK (NEEDLE) IMPLANT
NEEDLE HYPO 22GX1.5 SAFETY (NEEDLE) IMPLANT
NEEDLE HYPO 25X1 1.5 SAFETY (NEEDLE) ×1 IMPLANT
NEEDLE SPNL 22GX7 QUINCKE BK (NEEDLE) IMPLANT
PACK LITHOTOMY IV (CUSTOM PROCEDURE TRAY) ×2 IMPLANT
PACK PERINEAL COLD (PAD) ×2 IMPLANT
PACK VAGINAL WOMENS (CUSTOM PROCEDURE TRAY) ×2 IMPLANT
PAD PREP 24X48 CUFFED NSTRL (MISCELLANEOUS) ×2 IMPLANT
PAD TELFA 2X3 NADH STRL (GAUZE/BANDAGES/DRESSINGS) IMPLANT
PUNCH BIOPSY 3 (MISCELLANEOUS) IMPLANT
SCOPETTES 8  STERILE (MISCELLANEOUS) ×2
SCOPETTES 8 STERILE (MISCELLANEOUS) ×4 IMPLANT
SCRUB CHG 4% DYNA-HEX 4OZ (MISCELLANEOUS) ×2 IMPLANT
SOL PREP POV-IOD 4OZ 10% (MISCELLANEOUS) ×2 IMPLANT
SUT VIC AB 0 CT1 36 (SUTURE) IMPLANT
SUT VIC AB 2-0 SH 27 (SUTURE) ×2
SUT VIC AB 2-0 SH 27X BRD (SUTURE) ×8 IMPLANT
SUT VIC AB 2-0 SH 27XBRD (SUTURE) IMPLANT
SUT VIC AB 3-0 PS2 18 (SUTURE)
SUT VIC AB 3-0 PS2 18XBRD (SUTURE) IMPLANT
SUT VIC AB 3-0 SH 27 (SUTURE) ×2
SUT VIC AB 3-0 SH 27X BRD (SUTURE) ×4 IMPLANT
SUT VIC AB 4-0 PS2 18 (SUTURE) IMPLANT
SUT VIC AB 4-0 PS2 27 (SUTURE) ×4 IMPLANT
SUT VIC AB 4-0 SH 18 (SUTURE) ×4 IMPLANT
SUT VICRYL 0 UR6 27IN ABS (SUTURE) IMPLANT
SYR CONTROL 10ML LL (SYRINGE) ×2 IMPLANT
TOWEL OR 17X26 10 PK STRL BLUE (TOWEL DISPOSABLE) IMPLANT
TRAY FOLEY MTR SLVR 16FR STAT (SET/KITS/TRAYS/PACK) ×2 IMPLANT
TUBING CONNECTING 10 (TUBING) IMPLANT
VACUUM HOSE 7/8X10 W/ WAND (MISCELLANEOUS) ×2 IMPLANT
WATER STERILE IRR 500ML POUR (IV SOLUTION) ×2 IMPLANT

## 2022-01-20 NOTE — Transfer of Care (Signed)
Immediate Anesthesia Transfer of Care Note  Patient: [name]  Procedure(s) Performed: WIDE LOCAL EXCISION OF VULVA CO2 LASER APPLICATION TO VULVA VULVAR BIOPSY  Patient Location: PACU  Anesthesia Type:General  Level of Consciousness: awake, alert  and oriented  Airway & Oxygen Therapy: Patient Spontanous Breathing and Patient connected to face mask oxygen  Post-op Assessment: Report given to RN and Post -op Vital signs reviewed and stable  Post vital signs: Reviewed and stable  Last Vitals:  Vitals Value Taken Time  BP 137/75 01/20/22 1308  Temp    Pulse 60 01/20/22 1312  Resp 19 01/20/22 1312  SpO2 90 % 01/20/22 1312  Vitals shown include unvalidated device data.  Last Pain:  Vitals:   01/20/22 0956  TempSrc:   PainSc: 0-No pain         Complications: No notable events documented.

## 2022-01-20 NOTE — Anesthesia Preprocedure Evaluation (Signed)
Anesthesia Evaluation  Patient identified by MRN, date of birth, ID band Patient awake    Reviewed: Allergy & Precautions, NPO status , Patient's Chart, lab work & pertinent test results  Airway Mallampati: II  TM Distance: >3 FB Neck ROM: Full    Dental no notable dental hx.    Pulmonary sleep apnea , Current Smoker and Patient abstained from smoking.,    Pulmonary exam normal breath sounds clear to auscultation       Cardiovascular negative cardio ROS Normal cardiovascular exam Rhythm:Regular Rate:Normal     Neuro/Psych negative neurological ROS  negative psych ROS   GI/Hepatic negative GI ROS, Neg liver ROS,   Endo/Other  Morbid obesity  Renal/GU negative Renal ROS  negative genitourinary   Musculoskeletal negative musculoskeletal ROS (+)   Abdominal   Peds negative pediatric ROS (+)  Hematology negative hematology ROS (+)   Anesthesia Other Findings   Reproductive/Obstetrics negative OB ROS                             Anesthesia Physical Anesthesia Plan  ASA: 3  Anesthesia Plan: General   Post-op Pain Management: Toradol IV (intra-op)*   Induction: Intravenous  PONV Risk Score and Plan: 2 and Ondansetron and Dexamethasone  Airway Management Planned: Oral ETT  Additional Equipment:   Intra-op Plan:   Post-operative Plan: Extubation in OR  Informed Consent: I have reviewed the patients History and Physical, chart, labs and discussed the procedure including the risks, benefits and alternatives for the proposed anesthesia with the patient or authorized representative who has indicated his/her understanding and acceptance.     Dental advisory given  Plan Discussed with: CRNA and Surgeon  Anesthesia Plan Comments:         Anesthesia Quick Evaluation

## 2022-01-20 NOTE — Discharge Instructions (Addendum)
AFTER SURGERY INSTRUCTIONS   Return to work:  2-4 weeks if applicable   We recommend purchasing several bags of frozen green peas and dividing them into ziploc bags. You will want to keep these in the freezer and have them ready to use as ice packs to the vulvar incision. Once the ice pack is no longer cold, you can get another from the freezer. The frozen peas mold to your body better than a regular ice pack.  Apply silvadene cream to the lasered area on the vulva several times a day.    Activity: 1. Be up and out of the bed during the day.  Take a nap if needed.  You may walk up steps but be careful and use the hand rail.  Stair climbing will tire you more than you think, you may need to stop part way and rest.    2. No lifting or straining for 4 weeks over 10 pounds. No pushing, pulling, straining for 4 weeks.   3. No driving for minimum 24 hours after surgery.  Do not drive if you are taking narcotic pain medicine and make sure that your reaction time has returned.    4. You can shower as soon as the next day after surgery. Shower daily. No tub baths or submerging your body in water until cleared by your surgeon. If you have the soap that was given to you by pre-surgical testing that was used before surgery, you do not need to use it afterwards because this can irritate your incisions.    5. No sexual activity and nothing in the vagina for 4 weeks.   6. You may experience vaginal/vulvar spotting and discharge after surgery.  The spotting is normal but if you experience heavy bleeding, call our office.   7. Take Tylenol first for pain if you are able to take these medications and only use narcotic pain medication for severe pain not relieved by the Tylenol.  Monitor your Tylenol intake to a max of 4,000 mg in a 24 hour period.    Diet: 1. Low sodium Heart Healthy Diet is recommended but you are cleared to resume your normal (before surgery) diet after your procedure.   2. It is safe to  use a laxative, such as Miralax or Colace, if you have difficulty moving your bowels. You have been prescribed Sennakot at bedtime every evening to keep bowel movements regular and to prevent constipation.     Wound Care: 1. Keep clean and dry.  Shower daily.   Reasons to call the Doctor: Fever - Oral temperature greater than 100.4 degrees Fahrenheit Foul-smelling vaginal discharge Difficulty urinating Nausea and vomiting Increased pain at the site of the incision that is unrelieved with pain medicine. Difficulty breathing with or without chest pain New calf pain especially if only on one side Sudden, continuing increased vaginal bleeding with or without clots.   Contacts: For questions or concerns you should contact:   Dr. Jeral Pinch at [phone]   Joylene John, NP at [phone]   After Hours: call [phone] and have the GYN Oncologist paged/contacted (after 5 pm or on the weekends).   Messages sent via mychart are for non-urgent matters and are not responded to after hours so for urgent needs, please call the after hours number.

## 2022-01-20 NOTE — Anesthesia Procedure Notes (Signed)
Procedure Name: Intubation Date/Time: 01/20/2022 12:14 PM  Performed by: West Pugh, CRNAPre-anesthesia Checklist: Patient identified, Emergency Drugs available, Suction available, Patient being monitored and Timeout performed Patient Re-evaluated:Patient Re-evaluated prior to induction Oxygen Delivery Method: Circle system utilized Preoxygenation: Pre-oxygenation with 100% oxygen Induction Type: IV induction, Rapid sequence and Cricoid Pressure applied Laryngoscope Size: Mac and 4 Grade View: Grade II Tube type: Oral Tube size: 7.0 mm Number of attempts: 1 Airway Equipment and Method: Stylet Placement Confirmation: ETT inserted through vocal cords under direct vision, positive ETCO2, CO2 detector and breath sounds checked- equal and bilateral Secured at: 22 cm Tube secured with: Tape Dental Injury: Teeth and Oropharynx as per pre-operative assessment

## 2022-01-20 NOTE — Interval H&P Note (Signed)
History and Physical Interval Note:  01/20/2022 9:51 AM  [name]  has presented today for surgery, with the diagnosis of Vulvar intraepithelial neoplasia three.  The various methods of treatment have been discussed with the patient and family. After consideration of risks, benefits and other options for treatment, the patient has consented to  Procedure(s): WIDE LOCAL EXCISION OF VULVA (N/A) CO2 LASER APPLICATION TO VULVA (N/A) VULVAR BIOPSY POSSIBLE (N/A) as a surgical intervention.  The patient's history has been reviewed, patient examined, no change in status, stable for surgery.  I have reviewed the patient's chart and labs.  Questions were answered to the patient's satisfaction.     Lafonda Mosses

## 2022-01-20 NOTE — Anesthesia Postprocedure Evaluation (Signed)
Anesthesia Post Note  Patient: [name]  Procedure(s) Performed: WIDE LOCAL EXCISION OF VULVA CO2 LASER APPLICATION TO VULVA VULVAR BIOPSY     Patient location during evaluation: PACU Anesthesia Type: General Level of consciousness: awake and alert Pain management: pain level controlled Vital Signs Assessment: post-procedure vital signs reviewed and stable Respiratory status: spontaneous breathing, nonlabored ventilation, respiratory function stable and patient connected to nasal cannula oxygen Cardiovascular status: blood pressure returned to baseline and stable Postop Assessment: no apparent nausea or vomiting Anesthetic complications: no   No notable events documented.  Last Vitals:  Vitals:   01/20/22 1308 01/20/22 1315  BP: 137/75 131/79  Pulse: 64 61  Resp: 17 18  Temp: 36.7 C   SpO2: 93% 94%    Last Pain:  Vitals:   01/20/22 0956  TempSrc:   PainSc: 0-No pain                 Khanh Cordner S

## 2022-01-20 NOTE — Op Note (Signed)
PATIENT: [name] DATE: 01/20/22  Preop Diagnosis: High-grade vulvar dysplasia  Postoperative Diagnosis: same as above  Surgery: CO2 laser of the vulva, vulvar biopsies, simple vulvectomy  Surgeons:  Valarie Cones, MD  Assistant: Joylene John NP  Anesthesia: LMA  Estimated blood loss: 20 ml  IVF:  see I&O flowsheet   Urine output: n/a   Complications: None apparent  Pathology: left vulva, stitch at 12 o'clock; left labia minora biopsies  Operative findings: On EUA, 1 x 1.5 cm area of mildly raised tissue along the posterior left vulva with shallow ulceration. 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  Procedure: The patient was identified in the preoperative holding area. Informed consent was signed on the chart. Patient was seen history was reviewed and exam was performed.   The patient was then taken to the operating room and placed in the supine position with SCD hose on. General anesthesia was then induced without difficulty. She was then placed in the dorsolithotomy position. The perineum was prepped with Betadine. The vagina was prepped with Betadine. The patient was then draped after the prep was dried. A Foley catheter was inserted into the bladder under sterile conditions to drain the bladder then removed.  Timeout was performed the patient, procedure, antibiotic, allergy, and length of procedure. The vulvar tissues were inspected. The lesions were identified and the marking pen was used to circumscribe the area with appropriate surgical margins for the vulvectomy. The subcuticular tissues were infiltrated with 1% lidocaine. The 15 blade scalpel was used to make an incision through the skin circumferentially as marked. The skin elipse was grasped and was separated from the underlying deep dermal tissues with the bovie device. After the specimen had been completely resected, it was oriented and marked at 12 o'clock  with a 0-vicryl suture. The bovie was used to obtain hemostasis at the surgical bed. The subcutaneous tissues were irrigated and made hemostatic.   The deep dermal layer was approximated with 3-0vicryl mattress sutures to bring the skin edges into approximation and off tension. The wound was closed following langher's lines. The cutaneous layer was closed with interrupted 4-0 vicryl stitches and mattress sutures to ensure a tension free and hemostatic closure. The perineum was again irrigated.   The patient's surgical field was draped with wet towels. The staff and patient ensured laser-safe eyewear and masks were fitted. The laser was set to 8 watts continuous. The laser was tested for accuracy on a tongue depressor.  1% lidocaine was injected in the subcuticular tissues at the planned laser site. The laser was applied to the circumscribed area of the vulva that had been previously identified. The tissue was ablated to the desired depth and the eschar was removed with a moistened sponge. When the entire lesion had been ablated the procedure was complete.  Silvadine cream was applied to the laser site.  All instrument, suture, laparotomy, Ray-Tec, and needle counts were correct x2. The patient tolerated the procedure well and was taken recovery room in stable condition.   Jeral Pinch MD Gynecologic Oncology

## 2022-01-21 ENCOUNTER — Other Ambulatory Visit: Payer: Self-pay | Admitting: Gynecologic Oncology

## 2022-01-21 ENCOUNTER — Encounter (HOSPITAL_COMMUNITY): Payer: Self-pay | Admitting: Gynecologic Oncology

## 2022-01-21 ENCOUNTER — Telehealth: Payer: Self-pay

## 2022-01-21 DIAGNOSIS — R110 Nausea: Secondary | ICD-10-CM

## 2022-01-21 MED ORDER — ONDANSETRON HCL 4 MG PO TABS: 4.0000 mg | ORAL_TABLET | Freq: Three times a day (TID) | ORAL | 0 refills | Status: AC | PRN

## 2022-01-21 NOTE — Progress Notes (Signed)
See RN note.

## 2022-01-21 NOTE — Telephone Encounter (Addendum)
Spoke with [name] this morning. She states she is eating, drinking and urinating well but endorse nausea without emesis. She has not had a BM yet but is passing gas. She is taking senokot as prescribed and encouraged her to drink plenty of water. She denies fever or chills. Incisions are dry and intact with bleeding less than quarter size on maxi pad. She rates her pain 5/10. She said that she took one pain pill. Encouraged her to take her oxycodone for severe pain every 4 hours as needed or Tylenol as prescribed when pain is better controlled. Patient has not applied her Silvadene cream to the operative area but was encouraged to you several times a day.  Instructed to call office with any fever, chills, purulent drainage, uncontrolled pain or any other questions or concerns. Patient verbalizes understanding.   Encouraged patient to review post -op instructions provided on day of surgery.  Pt aware of post op appointments as well as the office number [phone] and after hours number [phone] to call if she has any questions or concerns   Called patient to advise that prescription for Zofran had been sent to her pharmacy. Explained that Zofran can be constipating. Reviewed use of Senna.

## 2022-01-22 ENCOUNTER — Telehealth: Payer: Self-pay | Admitting: Gynecologic Oncology

## 2022-01-22 LAB — SURGICAL PATHOLOGY

## 2022-01-22 NOTE — Telephone Encounter (Signed)
Called patient. Reviewed pathology from her. Discussed pain regimen and how to use various medications to help control her pain. Asked her to call tomorrow to let us know how she is doing.  Jeral Pinch MD Gynecologic Oncology

## 2022-01-26 ENCOUNTER — Telehealth: Payer: Self-pay

## 2022-01-26 NOTE — Telephone Encounter (Signed)
Pt called stating she is having white drainage coming from her incision. No odor.She has noticed blood on her pad about the size of 1/2 a dime. Her pain, in that area, varies. The worst is 7-8/10.  She has not taken pain medicine in a few days stating "she is trying to do it by herself" . She states it hurts to sit and when she coughs (as instructions state for her to do) it feels like she is ripping open in that area. She is eating/drinking. Having normal BM's/urinating.   Appointment offered for tomorrow. She states she doesn't think she can ride that far. She is going to send a picture through [place].

## 2022-01-27 NOTE — Telephone Encounter (Signed)
Called and spoke with patient to discuss pain management. Patient states pain at surgical site is 7/10. Patient has been taking her oxycodone at night because it makes her sleepy. Suggested taking Oxycodone at start of day followed by Tylenol as prescribed with goal of staying ahead of the pain. Patient also encouraged to use ice packs.  Minimal amount of white discharge on pad. No bleeding or fever.  Writer sent My Chart activation code. Patient's niece will be with patient today and will attempt to upload picture of surgical site.  Patient knows to call clinic with questions or concerns.

## 2022-01-28 ENCOUNTER — Encounter: Payer: Self-pay | Admitting: Gynecologic Oncology

## 2022-01-28 NOTE — Telephone Encounter (Addendum)
Called patient to advise of information and recommendations below. Reviewed in detail that if patient has increased redness around the area, increase in drainage, feeling poorly that she is to contact the clinic. Patient encouraged to use Tylenol as prescribed along with remaining  oxycodone. Patient had not been using Lidocaine ointment on area of concern but will start.  ----- Message from Dorothyann Gibbs, NP sent at 01/28/2022 11:53 AM EDT ----- Please let the patient know we have reviewed your picture. The area does not appear infected. The drainage around the edges is called fibrinous drainage and can be expected with an open wound. The incision has opened and this can happen over 50% of the time. It will heal from the inside out.   We recommend using the peri bottle frequently and using a sitz bath or hand held shower to irrigate the area several times a day. Continue with the topical lidocaine. Prevent constipation. Wear loose fitting clothing, cotton underwear or dresses with no underwear to let air get to the area.   If she notices increasing redness around the area, increase in drainage, feeling poorly, please call the office to be seen.

## 2022-01-30 ENCOUNTER — Telehealth: Payer: Self-pay

## 2022-01-30 ENCOUNTER — Encounter: Payer: Self-pay | Admitting: Gynecologic Oncology

## 2022-01-30 NOTE — Telephone Encounter (Signed)
Spoke with pt regarding recommendations provided in my chart message from [facility] NP.  Patient can use hand held shower head to direct water to vaginal area to rinse fibrinous tissue. Increase sitz-bath to 3-4 times daily. Use peri bottle and then place moistened gauze over the open area to help with collecting drainage.  Patient can take pressure off peri-area by sitting to one side with pillow for support.  Encouraged patient to have periods of time where she is keeping peri area open to air. Encouraged patient to eat diet that includes sources of protein to promote tissue health.  Patient voiced understanding of recommendations.

## 2022-02-02 ENCOUNTER — Telehealth: Payer: Self-pay

## 2022-02-02 ENCOUNTER — Encounter: Payer: Self-pay | Admitting: Gynecologic Oncology

## 2022-02-02 NOTE — Telephone Encounter (Signed)
Per Joylene John NP, [name] sent pictures of her incision. I connected with [name] and relayed information from [place].   Pt is aware  It looks larger due to the entire incision being open. This will heal from the inside out but is going to take time to heal esp given the location. It does not appear infected. She states she is feeling ok, pain is 5-6/10 on pain scale. She is dealing with it and only takes Oxycodone when absolutely needed. She has no fever/chills. She continues to use spray bottle in that area.

## 2022-02-04 ENCOUNTER — Telehealth: Payer: Self-pay

## 2022-02-04 NOTE — Telephone Encounter (Signed)
Physicians statement of disability emailed to [place] at [place] [place].  Pt aware

## 2022-02-19 ENCOUNTER — Other Ambulatory Visit: Payer: BC Managed Care – PPO

## 2022-02-19 ENCOUNTER — Encounter: Payer: BC Managed Care – PPO | Admitting: Genetic Counselor

## 2022-03-03 ENCOUNTER — Other Ambulatory Visit: Payer: Self-pay | Admitting: Genetic Counselor

## 2022-03-03 ENCOUNTER — Encounter: Payer: Self-pay | Admitting: Genetic Counselor

## 2022-03-03 ENCOUNTER — Inpatient Hospital Stay: Payer: BC Managed Care – PPO | Attending: Gynecologic Oncology | Admitting: Gynecologic Oncology

## 2022-03-03 ENCOUNTER — Inpatient Hospital Stay (HOSPITAL_BASED_OUTPATIENT_CLINIC_OR_DEPARTMENT_OTHER): Payer: BC Managed Care – PPO | Admitting: Genetic Counselor

## 2022-03-03 ENCOUNTER — Other Ambulatory Visit: Payer: Self-pay

## 2022-03-03 ENCOUNTER — Encounter: Payer: Self-pay | Admitting: Gynecologic Oncology

## 2022-03-03 ENCOUNTER — Inpatient Hospital Stay: Payer: BC Managed Care – PPO

## 2022-03-03 VITALS — BP 147/78 | HR 67 | Temp 98.1°F | Resp 18 | Ht 61.42 in | Wt 288.4 lb

## 2022-03-03 DIAGNOSIS — Z800 Family history of malignant neoplasm of digestive organs: Secondary | ICD-10-CM

## 2022-03-03 DIAGNOSIS — Z1379 Encounter for other screening for genetic and chromosomal anomalies: Secondary | ICD-10-CM

## 2022-03-03 DIAGNOSIS — Z8041 Family history of malignant neoplasm of ovary: Secondary | ICD-10-CM

## 2022-03-03 DIAGNOSIS — Z803 Family history of malignant neoplasm of breast: Secondary | ICD-10-CM

## 2022-03-03 DIAGNOSIS — Z9079 Acquired absence of other genital organ(s): Secondary | ICD-10-CM

## 2022-03-03 DIAGNOSIS — Z801 Family history of malignant neoplasm of trachea, bronchus and lung: Secondary | ICD-10-CM

## 2022-03-03 DIAGNOSIS — Z8042 Family history of malignant neoplasm of prostate: Secondary | ICD-10-CM

## 2022-03-03 DIAGNOSIS — Z720 Tobacco use: Secondary | ICD-10-CM

## 2022-03-03 DIAGNOSIS — D071 Carcinoma in situ of vulva: Secondary | ICD-10-CM

## 2022-03-03 DIAGNOSIS — Z6841 Body Mass Index (BMI) 40.0 and over, adult: Secondary | ICD-10-CM

## 2022-03-03 DIAGNOSIS — T8131XD Disruption of external operation (surgical) wound, not elsewhere classified, subsequent encounter: Secondary | ICD-10-CM

## 2022-03-03 LAB — GENETIC SCREENING ORDER

## 2022-03-03 NOTE — Progress Notes (Signed)
REFERRING PROVIDER: Jeral Pinch, MD  PRIMARY PROVIDER:  Arlyss Repress, MD  PRIMARY REASON FOR VISIT:  1. Family history of ovarian cancer     HISTORY OF PRESENT ILLNESS:   [name], a 56 y.o. female, was seen for a [place] cancer genetics consultation at the request of Dr. Berline Lopes due to a family history of cancer.  [name] presents to clinic today to discuss the possibility of a hereditary predisposition to cancer, to discuss genetic testing, and to further clarify her future cancer risks, as well as potential cancer risks for family members.   [name] is a 55 y.o. female with no personal history of cancer. She was recently diagnosed with high-grade vulvar dysplasia.  RISK FACTORS:  First live birth at age 87.  OCP use for approximately 0 years.  Ovaries intact: no.  Uterus intact: no.  Menopausal status: postmenopausal.  HRT use: 0 years. Colonoscopy: yes; normal. Mammogram within the last year: yes. Number of breast biopsies: 0. Up to date with pelvic exams: yes. Any excessive radiation exposure in the past: no  Past Medical History:  Diagnosis Date   Anxiety    BMI 50.0-59.9, adult (HCC)    COPD (chronic obstructive pulmonary disease) ([place])    Diverticulosis 03/14/2012   Dyspnea on exertion 08/14/2021   Fatty liver disease, nonalcoholic 03/54/6568   H. 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   OSA (obstructive sleep apnea) 04/06/2014   Peripheral edema 08/14/2021    Past Surgical History:  Procedure Laterality Date   ABDOMINAL HYSTERECTOMY  04/27/1997   BSO too   CHOLECYSTECTOMY     CO2 LASER APPLICATION N/A 05/23/5168   Procedure: CO2 LASER APPLICATION TO VULVA;  Surgeon: Lafonda Mosses, MD;  Location: [facility];  Service: Gynecology;  Laterality: N/A;   DILATION AND CURETTAGE OF UTERUS     for bleeding work-up before hysterectomy   HEMORRHOID SURGERY   04/27/1997   KNEE SURGERY  20 yrs ago   right   TONSILLECTOMY     VULVA Milagros Loll BIOPSY N/A 01/20/2022   Procedure: VULVAR BIOPSY;  Surgeon: Lafonda Mosses, MD;  Location: [facility];  Service: Gynecology;  Laterality: N/A;   VULVECTOMY N/A 01/20/2022   Procedure: WIDE LOCAL EXCISION OF VULVA;  Surgeon: Lafonda Mosses, MD;  Location: [facility];  Service: Gynecology;  Laterality: N/A;    Social History   Socioeconomic History   Marital status: Single    Spouse name: Not on file   Number of children: Not on file   Years of education: Not on file   Highest education level: Not on file  Occupational History   Not on file  Tobacco Use   Smoking status: Every Day    Packs/day: 1.00    Types: Cigarettes   Smokeless tobacco: Not on file  Vaping Use   Vaping Use: Never used  Substance and Sexual Activity   Alcohol use: No   Drug use: Never   Sexual activity: Not on file  Other Topics Concern   Not on file  Social History Narrative   Not on file   Social Determinants of Health   Financial Resource Strain: Not on file  Food Insecurity: Not on file  Transportation Needs: Not on file  Physical Activity: Not on file  Stress: Not on file  Social Connections: Not on file     FAMILY HISTORY:  We obtained a detailed, 4-generation family history.  Significant diagnoses are listed below: Family History  Problem Relation Age of Onset   Ovarian cancer Mother 15   Diabetes Father    Lung cancer Maternal Uncle    Cancer Maternal Uncle        unknown type   Cancer Paternal Uncle        unknown type   Cancer Paternal Uncle        unknown type   Prostate cancer Paternal Uncle        metastatic   Colon cancer Maternal Grandmother        dx. 54s/70s   Cancer Maternal Grandfather        unknown type   Breast cancer Cousin 18 - 85       maternal first cousin     [name]'s niece was diagnosed with a type of gynecological cancer at age 54. Her mother was diagnosed with  ovarian cancer at age 66 and died shortly after her diagnosis. She has two maternal uncles. One uncle was diagnosed with lung cancer and this uncle's daughter ([name] cousin) was diagnosed with breast cancer in her 20s, she died in her 36s due to metastatic breast cancer. Her second maternal uncle was diagnosed with an unknown type of cancer on his back/spine and his son ([name] cousin) was diagnosed with lung cancer, both are deceased. [name] maternal grandmother was diagnosed with colon cancer in her 55s/70s, she died in her 26s. Her maternal grandfather was diagnosed with an unknown type of cancer, he is deceased. [name] has 3 paternal uncles. Two paternal uncles were diagnosed with an unknown type of cancer at unknown ages and the third uncle was diagnosed with prostate cancer, all uncles are deceased. [name] is unaware of previous family history of genetic testing for hereditary cancer risks. There is no reported Ashkenazi Jewish ancestry.   GENETIC COUNSELING ASSESSMENT: Ms. [name] is a 56 y.o. female with a family history of cancer which is somewhat suggestive of a hereditary predisposition to cancer given her mother's history of ovarian cancer. We, therefore, discussed and recommended the following at today's visit.   DISCUSSION: We discussed that 5 - 10% of cancer is hereditary, with most cases of hereditary ovarian cancer associated with BRCA1/2.  There are other genes that can be associated with hereditary ovarian cancer syndromes.  We discussed that testing is beneficial for several reasons, including knowing about other cancer risks, identifying potential screening and risk-reduction options that may be appropriate, and to understanding if other family members could be at risk for cancer and allowing them to undergo genetic testing.  We reviewed the characteristics, features and inheritance patterns of hereditary cancer syndromes. We also discussed genetic testing, including  the appropriate family members to test, the process of testing, insurance coverage and turn-around-time for results. We discussed the implications of a negative, positive, carrier and/or variant of uncertain significant result. We discussed that negative results would be uninformative given that Ms. Dyches does not have a personal history of cancer.   Ms. Mims was offered a common hereditary cancer panel (47 genes) and an expanded pan-cancer panel (77 genes). Ms. Mohiuddin was informed of the benefits and limitations of each panel, including that expanded pan-cancer panels contain several genes that do not have clear management guidelines at this point in time.  We also discussed that as the number of genes included on a panel increases, the chances of variants of uncertain  significance increases.  After considering the benefits and limitations of each gene panel, Ms. Krager elected to have [facility] CancerNext-Expanded Panel.  The CancerNext-Expanded gene panel offered by [facility] and includes sequencing, rearrangement, and RNA analysis for the following 77 genes: AIP, ALK, APC, ATM, AXIN2, BAP1, BARD1, BLM, BMPR1A, BRCA1, BRCA2, BRIP1, CDC73, CDH1, CDK4, CDKN1B, CDKN2A, CHEK2, CTNNA1, DICER1, FANCC, FH, FLCN, GALNT12, KIF1B, LZTR1, MAX, MEN1, MET, MLH1, MSH2, MSH3, MSH6, MUTYH, NBN, NF1, NF2, NTHL1, PALB2, PHOX2B, PMS2, POT1, PRKAR1A, PTCH1, PTEN, RAD51C, RAD51D, RB1, RECQL, RET, SDHA, SDHAF2, SDHB, SDHC, SDHD, SMAD4, SMARCA4, SMARCB1, SMARCE1, STK11, SUFU, TMEM127, TP53, TSC1, TSC2, VHL and XRCC2 (sequencing and deletion/duplication); EGFR, EGLN1, HOXB13, KIT, MITF, PDGFRA, POLD1, and POLE (sequencing only); EPCAM and GREM1 (deletion/duplication only).    Based on Ms. Lankford's family history of cancer, she meets medical criteria for genetic testing. Despite that she meets criteria, she may still have an out of pocket cost. We discussed that if her out of pocket cost for testing is over $100, the laboratory will  call and confirm whether she wants to proceed with testing.  If the out of pocket cost of testing is less than $100 she will be billed by the genetic testing laboratory.   We discussed that some people do not want to undergo genetic testing due to fear of genetic discrimination.  A federal law called the Genetic Information Non-Discrimination Act (GINA) of 2008 helps protect individuals against genetic discrimination based on their genetic test results.  It impacts both health insurance and employment.  With health insurance, it protects against increased premiums, being kicked off insurance or being forced to take a test in order to be insured.  For employment it protects against hiring, firing and promoting decisions based on genetic test results.  GINA does not apply to those in the [employer], those who work for companies with less than 15 employees, and new life insurance or long-term disability insurance policies.  Health status due to a cancer diagnosis is not protected under GINA.  PLAN: After considering the risks, benefits, and limitations, Ms. Dimon provided informed consent to pursue genetic testing and the blood sample was sent to [facility] for analysis of the CancerNext-Expanded Panel. Results should be available within approximately 2-3 weeks' time, at which point they will be disclosed by telephone to Ms. Brett, as will any additional recommendations warranted by these results. Ms. Daus will receive a summary of her genetic counseling visit and a copy of her results once available. This information will also be available in Epic.   Ms. Gerard questions were answered to her satisfaction today. Our contact information was provided should additional questions or concerns arise. Thank you for the referral and allowing [place] to share in the care of your patient.   Lucille Passy, MS, [facility] Genetic Counselor [email or web address] (P) [phone]  The patient was seen for a total  of 35 minutes in face-to-face genetic counseling.  The patient brought her sister.  Drs. Lindi Adie and/or Burr Medico were available to discuss this case as needed.  _______________________________________________________________________ For Office Staff:  Number of people involved in session: 2 Was an Intern/ student involved with case: no

## 2022-03-03 NOTE — Patient Instructions (Addendum)
You are healing well from surgery.  Weeks to make sure the 1 small area that has not healed continues to heal.  After that, we will discuss having you see your OB/GYN every 6 months for follow-up given the risk of recurrence of precancer.

## 2022-03-03 NOTE — Progress Notes (Signed)
Gynecologic Oncology Return Clinic Visit  03/03/22  Reason for Visit: follow-up after surgery, treatment planning  Treatment History: 01/20/22: CO2 laser of the vulva, vulvar biopsies, simple vulvectomy   Interval History: Overall doing well but continues to have pain on her vulva.  Is having trouble sitting in certain positions.  Has noticed some itching in the area.  Occasionally has some bleeding.  Reports baseline bowel and bladder function.  Past Medical/Surgical History: Past Medical History:  Diagnosis Date   Anxiety    BMI 50.0-59.9, adult (HCC)    COPD (chronic obstructive pulmonary disease) (HCC)    Diverticulosis 03/14/2012   Dyspnea on exertion 08/14/2021   Fatty liver disease, nonalcoholic 03/14/2012   H. 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   OSA (obstructive sleep apnea) 04/06/2014   Peripheral edema 08/14/2021    Past Surgical History:  Procedure Laterality Date   ABDOMINAL HYSTERECTOMY  04/27/1997   BSO too   CHOLECYSTECTOMY     CO2 LASER APPLICATION N/A 01/20/2022   Procedure: CO2 LASER APPLICATION TO VULVA;  Surgeon: Carver Fila, MD;  Location: [facility];  Service: Gynecology;  Laterality: N/A;   DILATION AND CURETTAGE OF UTERUS     for bleeding work-up before hysterectomy   HEMORRHOID SURGERY  04/27/1997   KNEE SURGERY  20 yrs ago   right   TONSILLECTOMY     VULVA Ples Specter BIOPSY N/A 01/20/2022   Procedure: VULVAR BIOPSY;  Surgeon: Carver Fila, MD;  Location: [facility];  Service: Gynecology;  Laterality: N/A;   VULVECTOMY N/A 01/20/2022   Procedure: WIDE LOCAL EXCISION OF VULVA;  Surgeon: Carver Fila, MD;  Location: [facility];  Service: Gynecology;  Laterality: N/A;    Family History  Problem Relation Age of Onset   Ovarian cancer Mother    Diabetes Father    Colon cancer Maternal Grandmother     Social History   Socioeconomic History    Marital status: Single    Spouse name: Not on file   Number of children: Not on file   Years of education: Not on file   Highest education level: Not on file  Occupational History   Not on file  Tobacco Use   Smoking status: Every Day    Packs/day: 1.00    Types: Cigarettes   Smokeless tobacco: Not on file  Vaping Use   Vaping Use: Never used  Substance and Sexual Activity   Alcohol use: No   Drug use: Never   Sexual activity: Not on file  Other Topics Concern   Not on file  Social History Narrative   Not on file   Social Determinants of Health   Financial Resource Strain: Not on file  Food Insecurity: Not on file  Transportation Needs: Not on file  Physical Activity: Not on file  Stress: Not on file  Social Connections: Not on file    Current Medications:  Current Outpatient Medications:    albuterol (VENTOLIN HFA) 108 (90 Base) MCG/ACT inhaler, Inhale 2 puffs into the lungs as needed for shortness of breath or wheezing., Disp: , Rfl:    Diclofenac Sodium 3 % GEL, Apply 1 Application topically in the morning and at bedtime., Disp: , Rfl:    lidocaine (XYLOCAINE) 2 % jelly, Apply 1 Application topically as needed (vulva)., Disp: 20 mL, Rfl: 3   NYSTATIN powder, Apply 1 Application topically as needed (  sweat)., Disp: , Rfl:    ondansetron (ZOFRAN) 4 MG tablet, Take 1 tablet (4 mg total) by mouth every 8 (eight) hours as needed for nausea or vomiting., Disp: 3 tablet, Rfl: 0   oxyCODONE (OXY IR/ROXICODONE) 5 MG immediate release tablet, Take 1 tablet (5 mg total) by mouth every 4 (four) hours as needed for severe pain. For AFTER surgery only, do not take and drive, Disp: 15 tablet, Rfl: 0   promethazine (PHENERGAN) 25 MG tablet, Take 25 mg by mouth as needed for nausea or vomiting., Disp: , Rfl:    senna-docusate (SENOKOT-S) 8.6-50 MG tablet, Take 2 tablets by mouth at bedtime. For AFTER surgery, do not take if having diarrhea, Disp: 30 tablet, Rfl: 0   triamcinolone cream  (KENALOG) 0.1 %, Apply 1 Application topically as needed (itching)., Disp: , Rfl:   Review of Systems: + Weight gain, shortness of breath, leg swelling, joint pain, back pain, itching. Denies appetite changes, fevers, chills, fatigue. Denies hearing loss, neck lumps or masses, mouth sores, ringing in ears or voice changes. Denies cough or wheezing.  Denies chest pain or palpitations.  Denies abdominal distention, pain, blood in stools, constipation, diarrhea, nausea, vomiting, or early satiety. Denies pain with intercourse, dysuria, frequency, hematuria or incontinence. Denies hot flashes, pelvic pain, vaginal bleeding or vaginal discharge.   Denies muscle pain/cramps. Denies rash. Denies dizziness, headaches, numbness or seizures. Denies swollen lymph nodes or glands, denies easy bruising or bleeding. Denies anxiety, depression, confusion, or decreased concentration.  Physical Exam: BP (!) 147/78 (BP Location: Right Wrist, Patient Position: Sitting)   Pulse 67   Temp 98.1 F (36.7 C) (Oral)   Resp 18   Ht 5' 1.42" (1.56 m)   Wt 288 lb 6.4 oz (130.8 kg)   SpO2 96%   BMI 53.76 kg/m  General: Alert, oriented, no acute distress. HEENT: Normocephalic, atraumatic, sclera anicteric. Chest: Unlabored breathing on room air. Extremities: Grossly normal range of motion.  Warm, well perfused.  Trace edema bilaterally.  GU: Upper left vulva healed well after laser.  The posterior left vulvar incision has notable hypopigmentation related to scar tissue with still approximately a 1 x 0.5 cm area that looks somewhat erythematous and raw.  Laboratory & Radiologic Studies: A. 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  Patient is overall doing well but incision has healed more slowly as it opened.  There is still a small area that has not healed completely that is causing the patient's significant pain.  Discussed today continuing to use lidocaine and squirt bottle to rinse after she urinates that she has been doing.  Also gave her some gel to use as a barrier as well as 4 x 4's.  Additionally, I think she would benefit from using a peri-pad to give her a little bit more protection over this area.  I will plan to see her back in 6 weeks to reevaluate the vulvectomy wound.  Ultimately, plan will be to discharge her back to her OB/GYN for closer follow-up with visits every 6 months given risk  of recurrence of vulvar dysplasia.  18 minutes of total time was spent for this patient encounter, including preparation, face-to-face counseling with the patient and coordination of care, and documentation of the encounter.  Eugene Garnet, MD  Division of Gynecologic Oncology  Department of Obstetrics and Gynecology  [facility]

## 2022-03-16 ENCOUNTER — Telehealth: Payer: Self-pay | Admitting: Genetic Counselor

## 2022-03-16 ENCOUNTER — Ambulatory Visit: Payer: Self-pay | Admitting: Genetic Counselor

## 2022-03-16 ENCOUNTER — Encounter: Payer: Self-pay | Admitting: Genetic Counselor

## 2022-03-16 DIAGNOSIS — Z1379 Encounter for other screening for genetic and chromosomal anomalies: Secondary | ICD-10-CM | POA: Insufficient documentation

## 2022-03-16 NOTE — Telephone Encounter (Signed)
I contacted Ms. [name] to discuss her genetic testing results. No pathogenic variants were identified in the 77 genes analyzed. 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 Detailed clinic note to follow.  The test report has been scanned into EPIC and is located under the Molecular Pathology section of the Results Review tab.  A portion of the result report is included below for reference.   Alyssa Passy, MS, [facility] Genetic Counselor [email or web address] (P) [phone]

## 2022-03-16 NOTE — Progress Notes (Signed)
HPI:   [name] was previously seen in the [place] clinic due to a family history of cancer and concerns regarding a hereditary predisposition to cancer. Please refer to our prior cancer genetics clinic note for more information regarding our discussion, assessment and recommendations, at the time. [name] recent genetic test results were disclosed to her, as were recommendations warranted by these results. These results and recommendations are discussed in more detail below.  FAMILY HISTORY:  We obtained a detailed, 4-generation family history.  Significant diagnoses are listed below: Family History  Problem Relation Age of Onset   Ovarian cancer Mother 66   Diabetes Father    Lung cancer Maternal Uncle    Cancer Maternal Uncle        unknown type   Cancer Paternal Uncle        unknown type   Cancer Paternal Uncle        unknown type   Prostate cancer Paternal Uncle        metastatic   Colon cancer Maternal Grandmother        dx. 71s/70s   Cancer Maternal Grandfather        unknown type   Breast cancer Cousin 99 - 74       maternal first cousin         [name] niece was diagnosed with a type of gynecological cancer at age 3. Her mother was diagnosed with ovarian cancer at age 5 and died shortly after her diagnosis. She has two maternal uncles. One uncle was diagnosed with lung cancer and this uncle's daughter ([name] cousin) was diagnosed with breast cancer in her 20s, she died in her 43s due to metastatic breast cancer. Her second maternal uncle was diagnosed with an unknown type of cancer on his back/spine and his son ([name] cousin) was diagnosed with lung cancer, both are deceased. [name] maternal grandmother was diagnosed with colon cancer in her 63s/70s, she died in her 48s. Her maternal grandfather was diagnosed with an unknown type of cancer, he is deceased. [name] has 3 paternal uncles. Two paternal uncles were diagnosed with an  unknown type of cancer at unknown ages and the third uncle was diagnosed with prostate cancer, all uncles are deceased. [name] is unaware of previous family history of genetic testing for hereditary cancer risks. There is no reported Ashkenazi Jewish ancestry.   GENETIC TEST RESULTS:  The [facility] CancerNext-Expanded Panel found no pathogenic mutations.   The CancerNext-Expanded gene panel offered by [facility] and includes sequencing, rearrangement, and RNA analysis for the following 77 genes: AIP, ALK, APC, ATM, AXIN2, BAP1, BARD1, BLM, BMPR1A, BRCA1, BRCA2, BRIP1, CDC73, CDH1, CDK4, CDKN1B, CDKN2A, CHEK2, CTNNA1, DICER1, FANCC, FH, FLCN, GALNT12, KIF1B, LZTR1, MAX, MEN1, MET, MLH1, MSH2, MSH3, MSH6, MUTYH, NBN, NF1, NF2, NTHL1, PALB2, PHOX2B, PMS2, POT1, PRKAR1A, PTCH1, PTEN, RAD51C, RAD51D, RB1, RECQL, RET, SDHA, SDHAF2, SDHB, SDHC, SDHD, SMAD4, SMARCA4, SMARCB1, SMARCE1, STK11, SUFU, TMEM127, TP53, TSC1, TSC2, VHL and XRCC2 (sequencing and deletion/duplication); EGFR, EGLN1, HOXB13, KIT, MITF, PDGFRA, POLD1, and POLE (sequencing only); EPCAM and GREM1 (deletion/duplication only).   The test report has been scanned into EPIC and is located under the Molecular Pathology section of the Results Review tab.  A portion of the result report is included below for reference. Genetic testing reported out on 03/12/2022.       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  At this time, it is unknown if this variant is associated with an increased risk for cancer or if it is benign, but most uncertain variants are reclassified to benign. It should not be used to make medical management decisions. With time, we suspect the laboratory will determine the significance of this variant, if any. If the laboratory reclassifies this variant, we will attempt to contact [name] to discuss it further.   Even though a pathogenic variant was not identified,  possible explanations for the cancer in the family may include: There may be no hereditary risk for cancer in the family. The cancers in her family may be due to other genetic or environmental factors. There may be a gene mutation in one of these genes that current testing methods cannot detect, but that chance is small. There could be another gene that has not yet been discovered, or that we have not yet tested, that is responsible for the cancer diagnoses in the family.  It is also possible there is a hereditary cause for the cancer in the family that [name] did not inherit.  Therefore, it is important to remain in touch with cancer genetics in the future so that we can continue to offer [name] the most up to date genetic testing.   ADDITIONAL GENETIC TESTING:  We discussed with [name] that her genetic testing was fairly extensive.  If there are genes identified to increase cancer risk that can be analyzed in the future, we would be happy to discuss and coordinate this testing at that time.    CANCER SCREENING RECOMMENDATIONS:  [name] test result is considered negative (normal).  This means that we have not identified a hereditary cause for her family history of cancer at this time.   An individual's cancer risk and medical management are not determined by genetic test results alone. Overall cancer risk assessment incorporates additional factors, including personal medical history, family history, and any available genetic information that may result in a personalized plan for cancer prevention and surveillance. Therefore, it is recommended she continue to follow the cancer management and screening guidelines provided by her healthcare providers.  RECOMMENDATIONS FOR FAMILY MEMBERS:   Since she did not inherit a mutation in a cancer predisposition gene included on this panel, her son could not have inherited a mutation from her in one of these genes. Other members of the family may  still carry a pathogenic variant in one of these genes that [name] did not inherit. Based on the family history, we recommend her siblings have genetic counseling and testing.  We do not recommend familial testing for the MET variant of uncertain significance (VUS).  FOLLOW-UP:  Cancer genetics is a rapidly advancing field and it is possible that new genetic tests will be appropriate for her and/or her family members in the future. We encouraged her to remain in contact with cancer genetics on an annual basis so we can update her personal and family histories and let her know of advances in cancer genetics that may benefit this family.   Our contact number was provided. [name] questions were answered to her satisfaction, and she knows she is welcome to call us at anytime with additional questions or concerns.   Lucille Passy, MS, [facility] Genetic Counselor [email or web address] (P) [phone]

## 2022-03-18 ENCOUNTER — Encounter: Payer: Self-pay | Admitting: Genetic Counselor

## 2022-03-30 ENCOUNTER — Telehealth: Payer: Self-pay

## 2022-03-30 NOTE — Telephone Encounter (Signed)
Disability claim form faxed and confirmed to [facility].  [phone].  Pt aware and requests we also email it to her at [email or web address]

## 2022-04-13 ENCOUNTER — Encounter: Payer: Self-pay | Admitting: Gynecologic Oncology

## 2022-04-14 ENCOUNTER — Inpatient Hospital Stay: Payer: BC Managed Care – PPO | Attending: Gynecologic Oncology | Admitting: Gynecologic Oncology

## 2022-04-14 ENCOUNTER — Encounter: Payer: Self-pay | Admitting: Gynecologic Oncology

## 2022-04-14 VITALS — BP 152/94 | HR 81 | Temp 97.5°F | Resp 16 | Wt 291.3 lb

## 2022-04-14 DIAGNOSIS — D071 Carcinoma in situ of vulva: Secondary | ICD-10-CM

## 2022-04-14 DIAGNOSIS — Z9079 Acquired absence of other genital organ(s): Secondary | ICD-10-CM

## 2022-04-14 DIAGNOSIS — T8131XD Disruption of external operation (surgical) wound, not elsewhere classified, subsequent encounter: Secondary | ICD-10-CM

## 2022-04-14 NOTE — Patient Instructions (Signed)
It was good to see you today.  The area on your vulva has completely healed.  Please try Aquaphor or set a fill to see if this helps with moisturizing. Please remember, you will need to see your OB/GYN every 6 months initially to keep an eye on your vulva.  If you have any new symptoms, such as burning, itching, or pain, please call to see your OB/GYN sooner than your next scheduled visit.

## 2022-04-14 NOTE — Progress Notes (Signed)
Gynecologic Oncology Return Clinic Visit  04/14/22  Reason for Visit: follow-up after surgery, treatment planning   Treatment History: 01/20/22: CO2 laser of the vulva, vulvar biopsies, simple vulvectomy   Interval History: Doing well, has noticed some dryness of the area.  Denies any pain, discharge, or bleeding.  Has some very mild irritation with underwear rubbing against the skin there.  Past Medical/Surgical History: Past Medical History:  Diagnosis Date   Anxiety    BMI 50.0-59.9, adult (HCC)    COPD (chronic obstructive pulmonary disease) (HCC)    Diverticulosis 03/14/2012   Dyspnea on exertion 08/14/2021   Fatty liver disease, nonalcoholic 03/14/2012   H. 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   OSA (obstructive sleep apnea) 04/06/2014   Peripheral edema 08/14/2021    Past Surgical History:  Procedure Laterality Date   ABDOMINAL HYSTERECTOMY  04/27/1997   BSO too   CHOLECYSTECTOMY     CO2 LASER APPLICATION N/A 01/20/2022   Procedure: CO2 LASER APPLICATION TO VULVA;  Surgeon: Carver Fila, MD;  Location: [facility];  Service: Gynecology;  Laterality: N/A;   DILATION AND CURETTAGE OF UTERUS     for bleeding work-up before hysterectomy   HEMORRHOID SURGERY  04/27/1997   KNEE SURGERY  20 yrs ago   right   TONSILLECTOMY     VULVA Ples Specter BIOPSY N/A 01/20/2022   Procedure: VULVAR BIOPSY;  Surgeon: Carver Fila, MD;  Location: [facility];  Service: Gynecology;  Laterality: N/A;   VULVECTOMY N/A 01/20/2022   Procedure: WIDE LOCAL EXCISION OF VULVA;  Surgeon: Carver Fila, MD;  Location: [facility];  Service: Gynecology;  Laterality: N/A;    Family History  Problem Relation Age of Onset   Ovarian cancer Mother 44   Diabetes Father    Lung cancer Maternal Uncle    Cancer Maternal Uncle        unknown type   Cancer Paternal Uncle        unknown type   Cancer Paternal  Uncle        unknown type   Prostate cancer Paternal Uncle        metastatic   Colon cancer Maternal Grandmother        dx. 60s/70s   Cancer Maternal Grandfather        unknown type   Breast cancer Cousin 42 - 12       maternal first cousin    Social History   Socioeconomic History   Marital status: Single    Spouse name: Not on file   Number of children: Not on file   Years of education: Not on file   Highest education level: Not on file  Occupational History   Not on file  Tobacco Use   Smoking status: Every Day    Packs/day: 1.00    Types: Cigarettes   Smokeless tobacco: Not on file  Vaping Use   Vaping Use: Never used  Substance and Sexual Activity   Alcohol use: No   Drug use: Never   Sexual activity: Not on file  Other Topics Concern   Not on file  Social History Narrative   Not on file   Social Determinants of Health   Financial Resource Strain: Not on file  Food Insecurity: Not on file  Transportation Needs: Not on file  Physical Activity: Not on file  Stress: Not on file  Social Connections: Not on file    Current Medications:  Current Outpatient Medications:    albuterol (VENTOLIN HFA) 108 (90 Base) MCG/ACT inhaler, Inhale 2 puffs into the lungs as needed for shortness of breath or wheezing., Disp: , Rfl:    Diclofenac Sodium 3 % GEL, Apply 1 Application topically in the morning and at bedtime., Disp: , Rfl:    lidocaine (XYLOCAINE) 2 % jelly, Apply 1 Application topically as needed (vulva)., Disp: 20 mL, Rfl: 3   NYSTATIN powder, Apply 1 Application topically as needed (sweat)., Disp: , Rfl:    ondansetron (ZOFRAN) 4 MG tablet, Take 1 tablet (4 mg total) by mouth every 8 (eight) hours as needed for nausea or vomiting., Disp: 3 tablet, Rfl: 0   oxyCODONE (OXY IR/ROXICODONE) 5 MG immediate release tablet, Take 1 tablet (5 mg total) by mouth every 4 (four) hours as needed for severe pain. For AFTER surgery only, do not take and drive, Disp: 15 tablet,  Rfl: 0   promethazine (PHENERGAN) 25 MG tablet, Take 25 mg by mouth as needed for nausea or vomiting., Disp: , Rfl:    senna-docusate (SENOKOT-S) 8.6-50 MG tablet, Take 2 tablets by mouth at bedtime. For AFTER surgery, do not take if having diarrhea, Disp: 30 tablet, Rfl: 0   triamcinolone cream (KENALOG) 0.1 %, Apply 1 Application topically as needed (itching)., Disp: , Rfl:   Review of Systems: + shortness of breath Denies appetite changes, fevers, chills, fatigue, unexplained weight changes. Denies hearing loss, neck lumps or masses, mouth sores, ringing in ears or voice changes. Denies cough or wheezing.  Denies chest pain or palpitations. Denies leg swelling. Denies abdominal distention, pain, blood in stools, constipation, diarrhea, nausea, vomiting, or early satiety. Denies pain with intercourse, dysuria, frequency, hematuria or incontinence. Denies hot flashes, pelvic pain, vaginal bleeding or vaginal discharge.   Denies joint pain, back pain or muscle pain/cramps. Denies itching, rash, or wounds. Denies dizziness, headaches, numbness or seizures. Denies swollen lymph nodes or glands, denies easy bruising or bleeding. Denies anxiety, depression, confusion, or decreased concentration.  Physical Exam: BP (!) 152/94 (BP Location: Right Wrist, Patient Position: Sitting)   Pulse 81   Temp (!) 97.5 F (36.4 C) (Oral)   Resp 16   Wt 291 lb 4.8 oz (132.1 kg)   SpO2 93%   BMI 54.30 kg/m  General: Alert, oriented, no acute distress. HEENT: Normocephalic, atraumatic, sclera anicteric. Chest: Unlabored breathing on room air.  GU: Posterior left vulvar incision has lately healed now.  Mild hypopigmentation along the scar that healed by secondary intent.  No areas of leukoplakia.  Laboratory & Radiologic Studies: None new  Assessment & Plan: [name] is a 56 y.o. 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   Patient is doing  well.  Incision has healed completely now.  Discussed given symptoms of dryness, trying Cetaphil or Aquaphor.  In the setting of high-grade vulvar dysplasia, despite adequate treatment, there remains a risk of recurrence.  Given this, discussed the importance of close follow-up with her OB/GYN, which should initially be every 6 months.  Discussed that these visits will include a symptom review and exam.  We reviewed signs and symptoms that be concerning for dysplasia recurrence and I stressed the importance of calling if she develops any of these.  20 minutes of total time was spent for this patient encounter, including preparation, face-to-face counseling with the patient and coordination of care, and documentation of  the encounter.  Jeral Pinch, MD  Division of Gynecologic Oncology  Department of Obstetrics and Gynecology  [facility]

## 2022-04-15 ENCOUNTER — Telehealth: Payer: Self-pay

## 2022-04-15 NOTE — Telephone Encounter (Signed)
error 

## 2022-04-15 NOTE — Telephone Encounter (Signed)
Pt called office stating the return work note has to be emailed to HR at her office. 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  The note has to have an exact return date on it.   Appointment with Dr. Pricilla Holm was yesterday, and released to return to work. The date of today will be on note to HR. Pt is aware note will be faxed.

## 2022-08-10 ENCOUNTER — Encounter: Payer: Self-pay | Admitting: *Deleted

## 2022-08-11 ENCOUNTER — Telehealth: Payer: Self-pay

## 2022-08-11 NOTE — Telephone Encounter (Signed)
PT called stating she needs to be seen soon. Starting a week ago she is experiencing vaginal swelling, mainly on the labia (both sides) are red and raw, sometimes bleeds. No discharge/odor. She has not changed any kind of detergent/soaps. Nothing inserted in the vagina. She applies Vaseline and it comes right off. She states she wears breathable panties with cotton crotch and she can feel them "get stuck" and she has to pull them away from area. She can't wipe because it hurts and burns so badly. She has applied a cold wet wash cloth and that even burns. 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   Pt aware once we receive a picture we will call her back with advice

## 2022-08-11 NOTE — Telephone Encounter (Signed)
Pt states she called Dr.Mody's office and they don't have anything available until next month. She is scheduled with Warner Mccreedy NP on Friday 4/19 @ 2:00pm. Pt agrees to date and time and will still send a picture

## 2022-08-13 ENCOUNTER — Encounter: Payer: Self-pay | Admitting: Gynecologic Oncology

## 2022-08-14 ENCOUNTER — Inpatient Hospital Stay: Payer: BC Managed Care – PPO | Attending: Gynecologic Oncology | Admitting: Gynecologic Oncology

## 2022-08-14 ENCOUNTER — Other Ambulatory Visit: Payer: Self-pay

## 2022-08-14 ENCOUNTER — Telehealth: Payer: Self-pay

## 2022-08-14 ENCOUNTER — Other Ambulatory Visit: Payer: Self-pay | Admitting: Gynecologic Oncology

## 2022-08-14 ENCOUNTER — Inpatient Hospital Stay: Payer: BC Managed Care – PPO

## 2022-08-14 VITALS — BP 144/85 | HR 69 | Temp 98.2°F | Wt 282.5 lb

## 2022-08-14 DIAGNOSIS — R609 Edema, unspecified: Secondary | ICD-10-CM | POA: Insufficient documentation

## 2022-08-14 DIAGNOSIS — N9089 Other specified noninflammatory disorders of vulva and perineum: Secondary | ICD-10-CM | POA: Insufficient documentation

## 2022-08-14 DIAGNOSIS — D071 Carcinoma in situ of vulva: Secondary | ICD-10-CM

## 2022-08-14 DIAGNOSIS — Z86002 Personal history of in-situ neoplasm of other and unspecified genital organs: Secondary | ICD-10-CM | POA: Insufficient documentation

## 2022-08-14 DIAGNOSIS — N898 Other specified noninflammatory disorders of vagina: Secondary | ICD-10-CM

## 2022-08-14 DIAGNOSIS — R102 Pelvic and perineal pain: Secondary | ICD-10-CM | POA: Diagnosis not present

## 2022-08-14 DIAGNOSIS — Z9079 Acquired absence of other genital organ(s): Secondary | ICD-10-CM | POA: Diagnosis not present

## 2022-08-14 LAB — WET PREP, GENITAL
Clue Cells Wet Prep HPF POC: NONE SEEN
Sperm: NONE SEEN
Trich, Wet Prep: NONE SEEN
WBC, Wet Prep HPF POC: <10 (ref <10)
Yeast Wet Prep HPF POC: NONE SEEN

## 2022-08-14 MED ORDER — FLUCONAZOLE 150 MG PO TABS: 150.0000 mg | ORAL_TABLET | ORAL | 0 refills | Status: AC

## 2022-08-14 MED ORDER — NYSTATIN 100000 UNIT/GM EX POWD: 1.0000 | Freq: Two times a day (BID) | CUTANEOUS | 1 refills | Status: AC

## 2022-08-14 NOTE — Telephone Encounter (Signed)
See message below from [facility] NP,   Pt is aware of wet prep results. She will pick up medicine today and call on Monday 4/22 with an update.

## 2022-08-14 NOTE — Telephone Encounter (Signed)
-----   Message from Doylene Bode, NP sent at 08/14/2022  3:26 PM EDT ----- Please let the patient know the wet prep of the fluid from the vagina was unremarkable. We will plan to treat for yeast to see how her symptoms are after. I will send in a tablet she will take and topical powder she can sprinkle on the vulva when the skin is clean and dry. Please have her call us on Monday with an update. ----- Message ----- From: Interface, Lab In [place] Sent: 08/14/2022   3:21 PM EDT To: Doylene Bode, NP

## 2022-08-14 NOTE — Patient Instructions (Signed)
We will plan to send the discharge coming from the vagina to the lab to look at under the microscope. We will contact you with the results.  We will start treatment based on the results.   Continue trying to air out the skin on the vulva as much as able. Use the doughnut pillow to sit on to lift the vulva off of the truck seat.   You can use the peri bottle and pat dry. Do not use any strong soaps with fragrances to wash the vulva.   You can apply the topical lidocaine to the itching areas. Try not to scratch the vulva if able. You can try patting the area when it itches.  Please keep [place] posted on how you are doing and we can always get you back in the office if you are not doing better.

## 2022-08-17 ENCOUNTER — Telehealth: Payer: Self-pay

## 2022-08-17 NOTE — Telephone Encounter (Signed)
Pt called to give an update on S&S from visit on Friday. She states the area is no worse no better. She started wearing panties again yesterday and had to take them right back off.   Per Warner Mccreedy NP, pt is aware of taking the second Diflucan tomorrow and can use Hydrocortisone cream/Desitin to the area as a barrier. Pt voiced an understanding and will call back later in the week with an update.

## 2022-08-19 NOTE — Progress Notes (Signed)
Gynecologic Oncology Symptom Management Visit  08/14/2022  Reason for Visit: symptom management, vulvar symptoms   Treatment History: 01/20/22: CO2 laser of the vulva, vulvar biopsies, simple vulvectomy   Interval History: Patient presents to the office today for evaluation of vulvar symptoms.  She reports starting a week ago she began experiencing vaginal/labial swelling.  She reports the area as red and raw with occasional bleeding.  No abnormal discharge or odor.  She denies any changes to detergents or soaps.  Nothing has been inserted in the vagina.  She has been applying Vaseline to the vulva. She feels like her underwear sticks to the irritated areas on the vulva and she has to pull the fabric away from these areas.  She is not able to wipe after toileting due to pain and burning.  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  She states she feels like her vulva holds heat and stays hot when driving.  She feels okay otherwise.  She states she has lost weight while taking Ozempic and is feeling good about this.  Bowels and bladder functioning without difficulty. No other concerns voiced.  Past Medical/Surgical History: Past Medical History:  Diagnosis Date   Anxiety    BMI 50.0-59.9, adult    COPD (chronic obstructive pulmonary disease)    Diverticulosis 03/14/2012   Dyspnea on exertion 08/14/2021   Fatty liver disease, nonalcoholic 03/14/2012   H. 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   OSA (obstructive sleep apnea) 04/06/2014   Peripheral edema 08/14/2021    Past Surgical History:  Procedure Laterality Date   ABDOMINAL HYSTERECTOMY  04/27/1997   BSO too   CHOLECYSTECTOMY     CO2 LASER APPLICATION N/A 01/20/2022   Procedure: CO2 LASER APPLICATION TO VULVA;  Surgeon: Carver Fila, MD;  Location: [facility];  Service: Gynecology;  Laterality: N/A;   DILATION AND  CURETTAGE OF UTERUS     for bleeding work-up before hysterectomy   HEMORRHOID SURGERY  04/27/1997   KNEE SURGERY  20 yrs ago   right   TONSILLECTOMY     VULVA Ples Specter BIOPSY N/A 01/20/2022   Procedure: VULVAR BIOPSY;  Surgeon: Carver Fila, MD;  Location: [facility];  Service: Gynecology;  Laterality: N/A;   VULVECTOMY N/A 01/20/2022   Procedure: WIDE LOCAL EXCISION OF VULVA;  Surgeon: Carver Fila, MD;  Location: [facility];  Service: Gynecology;  Laterality: N/A;    Family History  Problem Relation Age of Onset   Ovarian cancer Mother 66   Diabetes Father    Lung cancer Maternal Uncle    Cancer Maternal Uncle        unknown type   Cancer Paternal Uncle        unknown type   Cancer Paternal Uncle        unknown type   Prostate cancer Paternal Uncle        metastatic   Colon cancer Maternal Grandmother        dx. 60s/70s   Cancer Maternal Grandfather        unknown type   Breast cancer Cousin 93 - 70       maternal first cousin    Social History   Socioeconomic History   Marital status: Single    Spouse name: Not on file   Number of children: Not on file   Years of education:  Not on file   Highest education level: Not on file  Occupational History   Not on file  Tobacco Use   Smoking status: Every Day    Packs/day: 1    Types: Cigarettes   Smokeless tobacco: Not on file  Vaping Use   Vaping Use: Never used  Substance and Sexual Activity   Alcohol use: No   Drug use: Never   Sexual activity: Not on file  Other Topics Concern   Not on file  Social History Narrative   Not on file   Social Determinants of Health   Financial Resource Strain: Not on file  Food Insecurity: No Food Insecurity (08/13/2022)   Hunger Vital Sign    Worried About Running Out of Food in the Last Year: Never true    Ran Out of Food in the Last Year: Never true  Transportation Needs: No Transportation Needs (08/13/2022)   PRAPARE - Administrator, Civil Service  (Medical): No    Lack of Transportation (Non-Medical): No  Physical Activity: Not on file  Stress: Not on file  Social Connections: Not on file    Current Medications:  Current Outpatient Medications:    albuterol (VENTOLIN HFA) 108 (90 Base) MCG/ACT inhaler, Inhale 2 puffs into the lungs as needed for shortness of breath or wheezing., Disp: , Rfl:    lidocaine (XYLOCAINE) 2 % jelly, Apply 1 Application topically as needed (vulva)., Disp: 20 mL, Rfl: 3   OZEMPIC, 1 MG/DOSE, 4 MG/3ML SOPN, Inject 0.5 mg into the skin daily., Disp: , Rfl:    triamcinolone cream (KENALOG) 0.1 %, Apply 1 Application topically as needed (itching)., Disp: , Rfl:    Diclofenac Sodium 3 % GEL, Apply 1 Application topically in the morning and at bedtime. (Patient not taking: Reported on 08/13/2022), Disp: , Rfl:    fluconazole (DIFLUCAN) 150 MG tablet, Take 1 tablet (150 mg total) by mouth as directed. Take one tablet once. You can repeat this in three days if symptoms not improved, Disp: 2 tablet, Rfl: 0   NYSTATIN powder, Apply 1 Application topically 2 (two) times daily. Apply to clean, dry skin on the vulva and skin folds, Disp: 15 g, Rfl: 1   ondansetron (ZOFRAN) 4 MG tablet, Take 1 tablet (4 mg total) by mouth every 8 (eight) hours as needed for nausea or vomiting. (Patient not taking: Reported on 08/13/2022), Disp: 3 tablet, Rfl: 0   oxyCODONE (OXY IR/ROXICODONE) 5 MG immediate release tablet, Take 1 tablet (5 mg total) by mouth every 4 (four) hours as needed for severe pain. For AFTER surgery only, do not take and drive (Patient not taking: Reported on 08/13/2022), Disp: 15 tablet, Rfl: 0   promethazine (PHENERGAN) 25 MG tablet, Take 25 mg by mouth as needed for nausea or vomiting. (Patient not taking: Reported on 08/13/2022), Disp: , Rfl:    senna-docusate (SENOKOT-S) 8.6-50 MG tablet, Take 2 tablets by mouth at bedtime. For AFTER surgery, do not take if having diarrhea (Patient not taking: Reported on 08/13/2022),  Disp: 30 tablet, Rfl: 0  Review of Systems: See interval. Additional review negative.  Physical Exam: BP (!) 144/85 (BP Location: Right Arm, Patient Position: Sitting)   Pulse 69   Temp 98.2 F (36.8 C) (Oral)   Wt 282 lb 8 oz (128.1 kg)   SpO2 97%   BMI 52.66 kg/m  General: Alert, oriented, no acute distress. HEENT: Normocephalic, atraumatic, sclera anicteric. Chest: Unlabored breathing on room air. Lungs clear. Heart regular  in rate and rhythm. GU: Posterior left vulvar incision is well healed. No obvious areas of leukoplakia. Excoriations/abrasions noted on the mons pubis and the vulva. Dryness and mild edema noted on the labia. No active bleeding noted. Whitish discharge noted at the introitus. Sample obtained for wet prep. No masses noted. No drainage noted from abrasions on the mons. No lesions similar to herpetic lesions noted (pt denies hx of this).  Laboratory & Radiologic Studies: None new  Assessment & Plan: [name] is a 57 y.o. 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   Sample of vaginal discharge obtained for wet prep. Pt will be contacted with the results. Vulvar care discussed. Topical lidocaine given for symptom relief. Given donut pillow. Reportable signs and symptoms reviewed. She will be contacted with recommendations moving forward.   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  Given this, she was advised to close follow-up with her OB/GYN, which should initially be every 6 months.    Warner Mccreedy NP [facility] Gynecologic Oncology
